# Patient Record
Sex: Female | Born: 1940 | Race: White | Hispanic: No | Smoking: Former smoker
Health system: Southern US, Community
[De-identification: ages and names within clinical notes are randomized; demographics above are authoritative.]

## PROBLEM LIST (undated history)

## (undated) DIAGNOSIS — F329 Major depressive disorder, single episode, unspecified: Secondary | ICD-10-CM

## (undated) DIAGNOSIS — I509 Heart failure, unspecified: Secondary | ICD-10-CM

## (undated) DIAGNOSIS — E119 Type 2 diabetes mellitus without complications: Secondary | ICD-10-CM

## (undated) DIAGNOSIS — F32A Depression, unspecified: Secondary | ICD-10-CM

## (undated) HISTORY — DX: Depression, unspecified: F32.A

## (undated) HISTORY — DX: Heart failure, unspecified: I50.9

## (undated) HISTORY — PX: TUBAL LIGATION: SHX77

## (undated) HISTORY — PX: SHOULDER ARTHROSCOPY: SHX128

## (undated) HISTORY — DX: Type 2 diabetes mellitus without complications: E11.9

## (undated) HISTORY — PX: CATARACT EXTRACTION, BILATERAL: SHX1313

## (undated) HISTORY — PX: CARDIAC SURGERY: SHX584

## (undated) HISTORY — PX: ABDOMINAL HYSTERECTOMY: SHX81

## (undated) HISTORY — DX: Major depressive disorder, single episode, unspecified: F32.9

## (undated) HISTORY — PX: OTHER SURGICAL HISTORY: SHX169

---

## 2015-04-28 DIAGNOSIS — G44219 Episodic tension-type headache, not intractable: Secondary | ICD-10-CM | POA: Diagnosis not present

## 2015-05-15 DIAGNOSIS — F419 Anxiety disorder, unspecified: Secondary | ICD-10-CM | POA: Diagnosis not present

## 2015-05-15 DIAGNOSIS — G894 Chronic pain syndrome: Secondary | ICD-10-CM | POA: Diagnosis not present

## 2015-05-15 DIAGNOSIS — E1165 Type 2 diabetes mellitus with hyperglycemia: Secondary | ICD-10-CM | POA: Diagnosis not present

## 2015-05-15 DIAGNOSIS — Z0000 Encounter for general adult medical examination without abnormal findings: Secondary | ICD-10-CM | POA: Diagnosis not present

## 2015-05-21 DIAGNOSIS — Z0000 Encounter for general adult medical examination without abnormal findings: Secondary | ICD-10-CM | POA: Diagnosis not present

## 2015-05-21 DIAGNOSIS — E1165 Type 2 diabetes mellitus with hyperglycemia: Secondary | ICD-10-CM | POA: Diagnosis not present

## 2015-08-17 DIAGNOSIS — I10 Essential (primary) hypertension: Secondary | ICD-10-CM | POA: Diagnosis not present

## 2015-08-17 DIAGNOSIS — Z1211 Encounter for screening for malignant neoplasm of colon: Secondary | ICD-10-CM | POA: Diagnosis not present

## 2015-08-17 DIAGNOSIS — T82898D Other specified complication of vascular prosthetic devices, implants and grafts, subsequent encounter: Secondary | ICD-10-CM | POA: Diagnosis not present

## 2015-08-17 DIAGNOSIS — L989 Disorder of the skin and subcutaneous tissue, unspecified: Secondary | ICD-10-CM | POA: Diagnosis not present

## 2015-08-17 DIAGNOSIS — E1165 Type 2 diabetes mellitus with hyperglycemia: Secondary | ICD-10-CM | POA: Diagnosis not present

## 2015-08-17 DIAGNOSIS — F341 Dysthymic disorder: Secondary | ICD-10-CM | POA: Diagnosis not present

## 2015-08-17 DIAGNOSIS — K5900 Constipation, unspecified: Secondary | ICD-10-CM | POA: Diagnosis not present

## 2015-09-07 DIAGNOSIS — Z5181 Encounter for therapeutic drug level monitoring: Secondary | ICD-10-CM | POA: Diagnosis not present

## 2015-10-06 DIAGNOSIS — F419 Anxiety disorder, unspecified: Secondary | ICD-10-CM | POA: Diagnosis not present

## 2015-10-06 DIAGNOSIS — L255 Unspecified contact dermatitis due to plants, except food: Secondary | ICD-10-CM | POA: Diagnosis not present

## 2015-10-06 DIAGNOSIS — G894 Chronic pain syndrome: Secondary | ICD-10-CM | POA: Diagnosis not present

## 2015-11-17 DIAGNOSIS — F419 Anxiety disorder, unspecified: Secondary | ICD-10-CM | POA: Diagnosis not present

## 2015-11-17 DIAGNOSIS — E118 Type 2 diabetes mellitus with unspecified complications: Secondary | ICD-10-CM | POA: Diagnosis not present

## 2015-11-17 DIAGNOSIS — G894 Chronic pain syndrome: Secondary | ICD-10-CM | POA: Diagnosis not present

## 2016-05-19 DIAGNOSIS — E1165 Type 2 diabetes mellitus with hyperglycemia: Secondary | ICD-10-CM | POA: Diagnosis not present

## 2016-05-19 DIAGNOSIS — I781 Nevus, non-neoplastic: Secondary | ICD-10-CM | POA: Diagnosis not present

## 2016-06-14 DIAGNOSIS — J019 Acute sinusitis, unspecified: Secondary | ICD-10-CM | POA: Diagnosis not present

## 2016-08-02 DIAGNOSIS — E559 Vitamin D deficiency, unspecified: Secondary | ICD-10-CM | POA: Diagnosis not present

## 2016-08-02 DIAGNOSIS — E119 Type 2 diabetes mellitus without complications: Secondary | ICD-10-CM | POA: Diagnosis not present

## 2016-08-02 DIAGNOSIS — R5383 Other fatigue: Secondary | ICD-10-CM | POA: Diagnosis not present

## 2016-08-02 DIAGNOSIS — E785 Hyperlipidemia, unspecified: Secondary | ICD-10-CM | POA: Diagnosis not present

## 2016-08-05 DIAGNOSIS — F329 Major depressive disorder, single episode, unspecified: Secondary | ICD-10-CM | POA: Diagnosis not present

## 2016-08-05 DIAGNOSIS — K219 Gastro-esophageal reflux disease without esophagitis: Secondary | ICD-10-CM | POA: Diagnosis not present

## 2016-08-05 DIAGNOSIS — Z6834 Body mass index (BMI) 34.0-34.9, adult: Secondary | ICD-10-CM | POA: Diagnosis not present

## 2016-08-05 DIAGNOSIS — Z0000 Encounter for general adult medical examination without abnormal findings: Secondary | ICD-10-CM | POA: Diagnosis not present

## 2016-10-25 ENCOUNTER — Ambulatory Visit (INDEPENDENT_AMBULATORY_CARE_PROVIDER_SITE_OTHER): Payer: Medicare Other | Admitting: Family

## 2016-10-25 ENCOUNTER — Encounter (INDEPENDENT_AMBULATORY_CARE_PROVIDER_SITE_OTHER): Payer: Self-pay

## 2016-10-25 ENCOUNTER — Telehealth: Payer: Self-pay | Admitting: Family

## 2016-10-25 ENCOUNTER — Encounter: Payer: Self-pay | Admitting: Family

## 2016-10-25 VITALS — BP 114/65 | HR 82 | Temp 97.1°F | Ht 63.5 in | Wt 203.6 lb

## 2016-10-25 DIAGNOSIS — Z951 Presence of aortocoronary bypass graft: Secondary | ICD-10-CM | POA: Diagnosis not present

## 2016-10-25 DIAGNOSIS — Z794 Long term (current) use of insulin: Secondary | ICD-10-CM

## 2016-10-25 DIAGNOSIS — I2510 Atherosclerotic heart disease of native coronary artery without angina pectoris: Secondary | ICD-10-CM | POA: Diagnosis not present

## 2016-10-25 DIAGNOSIS — E1169 Type 2 diabetes mellitus with other specified complication: Secondary | ICD-10-CM

## 2016-10-25 DIAGNOSIS — E1159 Type 2 diabetes mellitus with other circulatory complications: Secondary | ICD-10-CM | POA: Insufficient documentation

## 2016-10-25 DIAGNOSIS — E785 Hyperlipidemia, unspecified: Secondary | ICD-10-CM | POA: Diagnosis not present

## 2016-10-25 DIAGNOSIS — F5101 Primary insomnia: Secondary | ICD-10-CM | POA: Diagnosis not present

## 2016-10-25 DIAGNOSIS — E1165 Type 2 diabetes mellitus with hyperglycemia: Secondary | ICD-10-CM | POA: Diagnosis not present

## 2016-10-25 DIAGNOSIS — E119 Type 2 diabetes mellitus without complications: Secondary | ICD-10-CM | POA: Insufficient documentation

## 2016-10-25 DIAGNOSIS — E1149 Type 2 diabetes mellitus with other diabetic neurological complication: Secondary | ICD-10-CM | POA: Diagnosis not present

## 2016-10-25 LAB — BAYER DCA HB A1C WAIVED: HB A1C (BAYER DCA - WAIVED): 9.7 % — ABNORMAL HIGH (ref <7.0)

## 2016-10-25 MED ORDER — TRAZODONE HCL 50 MG PO TABS: 50.0000 mg | ORAL_TABLET | Freq: Every evening | ORAL | 1 refills | Status: DC | PRN

## 2016-10-25 NOTE — Patient Instructions (Signed)
Diabetes Mellitus and Food It is important for you to manage your blood sugar (glucose) level. Your blood glucose level can be greatly affected by what you eat. Eating healthier foods in the appropriate amounts throughout the day at about the same time each day will help you control your blood glucose level. It can also help slow or prevent worsening of your diabetes mellitus. Healthy eating may even help you improve the level of your blood pressure and reach or maintain a healthy weight. General recommendations for healthful eating and cooking habits include:  Eating meals and snacks regularly. Avoid going long periods of time without eating to lose weight.  Eating a diet that consists mainly of plant-based foods, such as fruits, vegetables, nuts, legumes, and whole grains.  Using low-heat cooking methods, such as baking, instead of high-heat cooking methods, such as deep frying.  Work with your dietitian to make sure you understand how to use the Nutrition Facts information on food labels. How can food affect me? Carbohydrates Carbohydrates affect your blood glucose level more than any other type of food. Your dietitian will help you determine how many carbohydrates to eat at each meal and teach you how to count carbohydrates. Counting carbohydrates is important to keep your blood glucose at a healthy level, especially if you are using insulin or taking certain medicines for diabetes mellitus. Alcohol Alcohol can cause sudden decreases in blood glucose (hypoglycemia), especially if you use insulin or take certain medicines for diabetes mellitus. Hypoglycemia can be a life-threatening condition. Symptoms of hypoglycemia (sleepiness, dizziness, and disorientation) are similar to symptoms of having too much alcohol. If your health care provider has given you approval to drink alcohol, do so in moderation and use the following guidelines:  Women should not have more than one drink per day, and men  should not have more than two drinks per day. One drink is equal to: ? 12 oz of beer. ? 5 oz of wine. ? 1 oz of hard liquor.  Do not drink on an empty stomach.  Keep yourself hydrated. Have water, diet soda, or unsweetened iced tea.  Regular soda, juice, and other mixers might contain a lot of carbohydrates and should be counted.  What foods are not recommended? As you make food choices, it is important to remember that all foods are not the same. Some foods have fewer nutrients per serving than other foods, even though they might have the same number of calories or carbohydrates. It is difficult to get your body what it needs when you eat foods with fewer nutrients. Examples of foods that you should avoid that are high in calories and carbohydrates but low in nutrients include:  Trans fats (most processed foods list trans fats on the Nutrition Facts label).  Regular soda.  Juice.  Candy.  Sweets, such as cake, pie, doughnuts, and cookies.  Fried foods.  What foods can I eat? Eat nutrient-rich foods, which will nourish your body and keep you healthy. The food you should eat also will depend on several factors, including:  The calories you need.  The medicines you take.  Your weight.  Your blood glucose level.  Your blood pressure level.  Your cholesterol level.  You should eat a variety of foods, including:  Protein. ? Lean cuts of meat. ? Proteins low in saturated fats, such as fish, egg whites, and beans. Avoid processed meats.  Fruits and vegetables. ? Fruits and vegetables that may help control blood glucose levels, such as apples,   mangoes, and yams.  Dairy products. ? Choose fat-free or low-fat dairy products, such as milk, yogurt, and cheese.  Grains, bread, pasta, and rice. ? Choose whole grain products, such as multigrain bread, whole oats, and brown rice. These foods may help control blood pressure.  Fats. ? Foods containing healthful fats, such as  nuts, avocado, olive oil, canola oil, and fish.  Does everyone with diabetes mellitus have the same meal plan? Because every person with diabetes mellitus is different, there is not one meal plan that works for everyone. It is very important that you meet with a dietitian who will help you create a meal plan that is just right for you. This information is not intended to replace advice given to you by your health care provider. Make sure you discuss any questions you have with your health care provider. Document Released: 11/25/2004 Document Revised: 08/06/2015 Document Reviewed: 01/25/2013 Elsevier Interactive Patient Education  2017 Elsevier Inc.  

## 2016-10-25 NOTE — Progress Notes (Signed)
Subjective:    Patient ID: [place], female    DOB: 10-03-1940, 76 y.o.   MRN: [id number]  Pt presents to the office today to establish care. PT recently moved to this area from [place]. PT states she had a CABG in 2008, but has not seen a Cardiologists since.  Diabetes  She presents for her initial diabetic visit. She has type 2 diabetes mellitus. Her disease course has been worsening. There are no hypoglycemic associated symptoms. Associated symptoms include foot paresthesias. Pertinent negatives for diabetes include no blurred vision, no foot ulcerations and no visual change. There are no hypoglycemic complications. Symptoms are stable. Diabetic complications include heart disease and peripheral neuropathy. Pertinent negatives for diabetic complications include no CVA or nephropathy. Risk factors for coronary artery disease include dyslipidemia, diabetes mellitus and female sex. She is following a generally unhealthy diet. Her breakfast blood glucose range is generally >200 mg/dl. Eye exam is not current.  Hyperlipidemia  This is a chronic problem. The current episode started more than 1 year ago. The problem is uncontrolled. Recent lipid tests were reviewed and are high. Exacerbating diseases include obesity. Current antihyperlipidemic treatment includes statins. The current treatment provides mild improvement of lipids. Risk factors for coronary artery disease include diabetes mellitus, dyslipidemia, post-menopausal, a sedentary lifestyle and family history.  Insomnia  Primary symptoms: difficulty falling asleep, frequent awakening.  The current episode started more than one year. The onset quality is sudden. The problem has been waxing and waning since onset. The treatment provided mild relief.  Diabetic Neuropathy PT stats this is intermittent and denies any pain, but stiffness and numbness.     Review of Systems  Eyes: Negative for blurred vision.  Psychiatric/Behavioral: The  patient has insomnia.   All other systems reviewed and are negative.   Family History  Problem Relation Age of Onset  . Heart disease Father    Social History   Social History  . Marital status: Widowed    Spouse name: N/A  . Number of children: N/A  . Years of education: N/A   Social History Main Topics  . Smoking status: Never Smoker  . Smokeless tobacco: Never Used  . Alcohol use No  . Drug use: No  . Sexual activity: Yes   Other Topics Concern  . None   Social History Narrative  . None       Objective:   Physical Exam  Constitutional: She is oriented to person, place, and time. She appears well-developed and well-nourished. No distress.  HENT:  Head: Normocephalic and atraumatic.  Right Ear: External ear normal.  Left Ear: External ear normal.  Nose: Nose normal.  Mouth/Throat: Oropharynx is clear and moist.  Eyes: Pupils are equal, round, and reactive to light.  Neck: Normal range of motion. Neck supple. No thyromegaly present.  Cardiovascular: Normal rate, regular rhythm, normal heart sounds and intact distal pulses.   No murmur heard. Pulmonary/Chest: Effort normal and breath sounds normal. No respiratory distress. She has no wheezes.  Abdominal: Soft. Bowel sounds are normal. She exhibits no distension. There is no tenderness.  Musculoskeletal: Normal range of motion. She exhibits no edema or tenderness.  Neurological: She is alert and oriented to person, place, and time.  Skin: Skin is warm and dry.  Psychiatric: She has a normal mood and affect. Her behavior is normal. Judgment and thought content normal.  Vitals reviewed.   See Diabetic foot note  Ht 5' 3.5" (1.613 m)   Wt 203 lb 9.6  oz (92.4 kg)   BMI 35.50 kg/m      Assessment & Plan:  1. Type 2 diabetes mellitus with hyperglycemia, with long-term current use of insulin (HCC) - Bayer DCA Hb A1c Waived - CMP14+EGFR - Microalbumin / creatinine urine ratio - Ambulatory referral to  Ophthalmology  2. Hyperlipidemia associated with type 2 diabetes mellitus ([place]) - CMP14+EGFR - Lipid panel - Ambulatory referral to Cardiology  3. Coronary artery disease involving native heart without angina pectoris, unspecified vessel or lesion type - CMP14+EGFR - Ambulatory referral to Cardiology  4. Hx of CAB - CMP14+EGFR - Ambulatory referral to Cardiology  5. Other diabetic neurological complication associated with type 2 diabetes mellitus ([place]) - CMP14+EGFR  6. Primary insomnia Pt started on trazodone today Sleep ritual discussed - traZODone (DESYREL) 50 MG tablet; Take 1-2 tablets (50-100 mg total) by mouth at bedtime as needed for sleep.  Dispense: 60 tablet; Refill: 1   Continue all meds Labs pending Health Maintenance reviewed Diet and exercise encouraged RTO 3 months   Evelina Dun, FNP

## 2016-10-26 LAB — CMP14+EGFR
ALK PHOS: 115 IU/L (ref 39–117)
ALT: 17 IU/L (ref 0–32)
AST: 16 IU/L (ref 0–40)
Albumin/Globulin Ratio: 2.4 — ABNORMAL HIGH (ref 1.2–2.2)
Albumin: 4.3 g/dL (ref 3.5–4.8)
BUN/Creatinine Ratio: 19 (ref 12–28)
BUN: 20 mg/dL (ref 8–27)
Bilirubin Total: 0.2 mg/dL (ref 0.0–1.2)
CO2: 24 mmol/L (ref 20–29)
CREATININE: 1.04 mg/dL — AB (ref 0.57–1.00)
Calcium: 9.5 mg/dL (ref 8.7–10.3)
Chloride: 102 mmol/L (ref 96–106)
GFR calc Af Amer: 60 mL/min/{1.73_m2} (ref >59)
GFR calc non Af Amer: 52 mL/min/{1.73_m2} — ABNORMAL LOW (ref >59)
GLOBULIN, TOTAL: 1.8 g/dL (ref 1.5–4.5)
GLUCOSE: 187 mg/dL — AB (ref 65–99)
Potassium: 5.1 mmol/L (ref 3.5–5.2)
SODIUM: 141 mmol/L (ref 134–144)
Total Protein: 6.1 g/dL (ref 6.0–8.5)

## 2016-10-26 LAB — LIPID PANEL
CHOLESTEROL TOTAL: 147 mg/dL (ref 100–199)
Chol/HDL Ratio: 3.1 ratio (ref 0.0–4.4)
HDL: 47 mg/dL (ref >39)
LDL CALC: 81 mg/dL (ref 0–99)
TRIGLYCERIDES: 93 mg/dL (ref 0–149)
VLDL Cholesterol Cal: 19 mg/dL (ref 5–40)

## 2016-10-26 LAB — MICROALBUMIN / CREATININE URINE RATIO
Creatinine, Urine: 38.9 mg/dL
Microalb/Creat Ratio: <7.7 mg/g creat (ref 0.0–30.0)
Microalbumin, Urine: <3 ug/mL

## 2016-10-27 ENCOUNTER — Other Ambulatory Visit: Payer: Self-pay | Admitting: Family

## 2016-10-27 NOTE — Telephone Encounter (Signed)
Pt called and said it was [facility] in [place] [address]

## 2016-10-28 ENCOUNTER — Other Ambulatory Visit: Payer: Self-pay | Admitting: Family

## 2016-10-28 MED ORDER — INSULIN DEGLUDEC 200 UNIT/ML ~~LOC~~ SOPN: 84.0000 [IU] | PEN_INJECTOR | Freq: Every day | SUBCUTANEOUS | 3 refills | Status: DC

## 2016-11-03 ENCOUNTER — Other Ambulatory Visit: Payer: Self-pay | Admitting: Family

## 2016-11-03 MED ORDER — INSULIN DETEMIR 100 UNIT/ML ~~LOC~~ SOLN: 54.0000 [IU] | Freq: Two times a day (BID) | SUBCUTANEOUS | 6 refills | Status: DC

## 2016-12-07 DIAGNOSIS — Z0289 Encounter for other administrative examinations: Secondary | ICD-10-CM

## 2016-12-13 ENCOUNTER — Telehealth: Payer: Self-pay | Admitting: Family

## 2016-12-13 MED ORDER — NITROGLYCERIN 0.4 MG SL SUBL: 0.4000 mg | SUBLINGUAL_TABLET | SUBLINGUAL | 3 refills | Status: AC | PRN

## 2016-12-13 MED ORDER — METFORMIN HCL 1000 MG PO TABS: 1000.0000 mg | ORAL_TABLET | Freq: Two times a day (BID) | ORAL | 1 refills | Status: DC

## 2016-12-13 MED ORDER — PRAVASTATIN SODIUM 40 MG PO TABS: 40.0000 mg | ORAL_TABLET | Freq: Every day | ORAL | 1 refills | Status: DC

## 2016-12-13 MED ORDER — GLIPIZIDE 10 MG PO TABS: 10.0000 mg | ORAL_TABLET | Freq: Every day | ORAL | 1 refills | Status: DC

## 2016-12-13 NOTE — Telephone Encounter (Signed)
Prescriptions sent to pharmacy

## 2017-01-03 ENCOUNTER — Ambulatory Visit (INDEPENDENT_AMBULATORY_CARE_PROVIDER_SITE_OTHER): Payer: Medicare Other | Admitting: Cardiovascular Disease

## 2017-01-03 ENCOUNTER — Encounter: Payer: Self-pay | Admitting: Cardiovascular Disease

## 2017-01-03 VITALS — BP 118/72 | HR 96 | Ht 62.5 in | Wt 204.0 lb

## 2017-01-03 DIAGNOSIS — E785 Hyperlipidemia, unspecified: Secondary | ICD-10-CM

## 2017-01-03 DIAGNOSIS — R0600 Dyspnea, unspecified: Secondary | ICD-10-CM | POA: Diagnosis not present

## 2017-01-03 DIAGNOSIS — I25708 Atherosclerosis of coronary artery bypass graft(s), unspecified, with other forms of angina pectoris: Secondary | ICD-10-CM

## 2017-01-03 DIAGNOSIS — G4730 Sleep apnea, unspecified: Secondary | ICD-10-CM

## 2017-01-03 DIAGNOSIS — R5383 Other fatigue: Secondary | ICD-10-CM

## 2017-01-03 DIAGNOSIS — Z951 Presence of aortocoronary bypass graft: Secondary | ICD-10-CM

## 2017-01-03 DIAGNOSIS — R0609 Other forms of dyspnea: Secondary | ICD-10-CM | POA: Diagnosis not present

## 2017-01-03 NOTE — Patient Instructions (Signed)
Your physician recommends that you schedule a follow-up appointment in: 2 months with Latta    Your physician recommends that you continue on your current medications as directed. Please refer to the Current Medication list given to you today.   If you need a refill on your cardiac medications before your next appointment, please call your pharmacy.    Your physician has requested that you have a lexiscan myoview. For further information please visit [email or web address]. Please follow instruction sheet, as given.  Your physician has requested that you have an echocardiogram. Echocardiography is a painless test that uses sound waves to create images of your heart. It provides your doctor with information about the size and shape of your heart and how well your heart's chambers and valves are working. This procedure takes approximately one hour. There are no restrictions for this procedure.  Your physician has recommended that you have a sleep study. This test records several body functions during sleep, including: brain activity, eye movement, oxygen and carbon dioxide blood levels, heart rate and rhythm, breathing rate and rhythm, the flow of air through your mouth and nose, snoring, body muscle movements, and chest and belly movement.   You have been referred to Electrophysiology for Pacer Check, they will call you with an apt         Thank you for choosing [facility] !

## 2017-01-03 NOTE — Progress Notes (Addendum)
CARDIOLOGY CONSULT NOTE  Patient ID: [place] MRN: [id number] DOB/AGE: [birth year] 76 y.o.  Admit date: (Not on file) Primary Physician: Sharion Balloon, FNP Referring Physician: Evelina Dun FNP  Reason for Consultation: CAD with CABG  HPI: [place] is a 76 y.o. female who is being seen today for the evaluation of coronary artery disease with a history of CABG at the request of Sharion Balloon, FNP.   CABG was p[place]erfromed in [place] in 2008 and she recently moved to [place].  She also has type 2 diabetes and dyslipidemia.  Lipids 10/25/16: TC 147, TG 93, HDL 47, LDL 81.  Renal function was normal on 10/25/16.  [place] She grew up [place] and moved to [place] to be with her boyfriend of 3-4 years.  [place] I have requested records.  She followed up with a cardiologist once after her surgery but not since that time.  She says she also has a defibrillator. She denies shocks from her device.  She said prior to CABG, she had left arm pain and felt sick to her stomach.  She denies such symptoms at this time.  However, she has had exertional fatigue for about a year. She denies exertional chest pain but does lose her breath fairly quickly with exertion on level ground.  She denies palpitations and leg swelling. She has woken up gasping for breath on occasion, and has also awoken herself with her snoring.  ECG performed in our office today which I personally interpreted demonstrates sinus rhythm with occasional ventricular paced beats. There are no significant ST-T abnormalities.    No Known Allergies  Current Outpatient Prescriptions  Medication Sig Dispense Refill  . aspirin EC 81 MG tablet Take 81 mg by mouth daily.    Marland Kitchen glipiZIDE (GLUCOTROL) 10 MG tablet Take 1 tablet (10 mg total) by mouth daily before breakfast. 90 tablet 1    . insulin aspart (NOVOLOG) 100 UNIT/ML injection Inject 7 Units into the skin as needed for high blood sugar.    . insulin detemir (LEVEMIR) 100 UNIT/ML injection Inject 0.54 mLs (54 Units total) into the skin 2 (two) times daily. 10 mL 6  . metFORMIN (GLUCOPHAGE) 1000 MG tablet Take 1 tablet (1,000 mg total) by mouth 2 (two) times daily with a meal. 180 tablet 1  . nitroGLYCERIN (NITROSTAT) 0.4 MG SL tablet Place 1 tablet (0.4 mg total) under the tongue every 5 (five) minutes as needed for chest pain. 50 tablet 3  . pravastatin (PRAVACHOL) 40 MG tablet Take 1 tablet (40 mg total) by mouth daily. 90 tablet 1   No current facility-administered medications for this visit.     Past Medical History:  Diagnosis Date  . CHF (congestive heart failure) ([place])   . Depression   . Diabetes mellitus without complication [facility])     Past Surgical History:  Procedure Laterality Date  . ABDOMINAL HYSTERECTOMY    . CARDIAC SURGERY     Blockages  . carpel tunnel bilaterally    . CATARACT EXTRACTION, BILATERAL    . great toe surgery Left   . SHOULDER ARTHROSCOPY Right    Spur removal  . TUBAL LIGATION      Social History   Social History  . Marital status: Widowed    Spouse name: N/A  . Number of children: N/A  . Years of education:  N/A   Occupational History  . Not on file.   Social History Main Topics  . Smoking status: Never Smoker  . Smokeless tobacco: Never Used  . Alcohol use No  . Drug use: No  . Sexual activity: Yes   Other Topics Concern  . Not on file   Social History Narrative  . No narrative on file     No family history of premature CAD in 1st degree relatives.  Current Meds  Medication Sig  . aspirin EC 81 MG tablet Take 81 mg by mouth daily.  Marland Kitchen glipiZIDE (GLUCOTROL) 10 MG tablet Take 1 tablet (10 mg total) by mouth daily before breakfast.  . insulin aspart (NOVOLOG) 100 UNIT/ML injection Inject 7 Units into the skin as needed for high blood sugar.  . insulin  detemir (LEVEMIR) 100 UNIT/ML injection Inject 0.54 mLs (54 Units total) into the skin 2 (two) times daily.  . metFORMIN (GLUCOPHAGE) 1000 MG tablet Take 1 tablet (1,000 mg total) by mouth 2 (two) times daily with a meal.  . nitroGLYCERIN (NITROSTAT) 0.4 MG SL tablet Place 1 tablet (0.4 mg total) under the tongue every 5 (five) minutes as needed for chest pain.  . pravastatin (PRAVACHOL) 40 MG tablet Take 1 tablet (40 mg total) by mouth daily.      Review of systems complete and found to be negative unless listed above in HPI    Physical exam Blood pressure 118/72, pulse 96, height 5' 2.5" (1.588 m), weight 204 lb (92.5 kg), SpO2 97 %. General: NAD Neck: No JVD, no thyromegaly or thyroid nodule.  Lungs: Clear to auscultation bilaterally with normal respiratory effort. CV: Nondisplaced PMI. Regular rate and rhythm, normal S1/S2, no S3/S4, no murmur.  No peripheral edema.  No carotid bruit.    Abdomen: Soft, nontender, no distention.  Skin: Intact without lesions or rashes.  Neurologic: Alert and oriented x 3.  Psych: Normal affect. Extremities: No clubbing or cyanosis.  HEENT: Normal.   ECG: Most recent ECG reviewed.   Labs: Lab Results  Component Value Date/Time   K 5.1 10/25/2016 09:48 AM   BUN 20 10/25/2016 09:48 AM   CREATININE 1.04 (H) 10/25/2016 09:48 AM   ALT 17 10/25/2016 09:48 AM     Lipids: Lab Results  Component Value Date/Time   LDLCALC 81 10/25/2016 09:48 AM   CHOL 147 10/25/2016 09:48 AM   TRIG 93 10/25/2016 09:48 AM   HDL 47 10/25/2016 09:48 AM        ASSESSMENT AND PLAN:  1. Coronary artery disease with history of CABG with progressive exertional fatigue and dyspnea: It is unclear if these represent anginal symptoms. I will proceed with a nuclear myocardial perfusion imaging study to evaluate for ischemia (Lexiscan Myoview). Continue ASA and pravastatin. Will try and obtain records from [facility] [place]  2. Dyslipidemia:  Lipids reviewed above. Ideally, LDL should be lower. Will monitor.  3. Progressive exertional dyspnea and paroxysmal nocturnal dyspnea: JVP is normal and she has no leg edema or pulmonary edema on exam. I will order a 2-D echocardiogram with Doppler to evaluate cardiac structure, function, and regional wall motion.  4. AICD: I will enroll her in device clinic for routine monitoring and surveillance. Denies ICD shocks. She does not know who is her device manufacturer. I will try and obtain records from [place].  5. Progressive fatigue with snoring (sleep disordered breathing): I will obtain a sleep study. I told her that sleep apnea is an independent  risk factor for CVA, MI, and arrhythmias.   Disposition: Follow up in 2 months   Signed: Kate Sable, M.D., F.A.C.C.  01/03/2017, 11:56 AM   ADDENDUM: Records from [place] received. Patient underwent 5-vessel CABG with LIMA to LAD, SVG to 2nd diagonal, SVG to 1st obtuse marginal, and sequential SVG's to right PDA and posterolateral coronary arteries on 10/23/2006.  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

## 2017-01-05 ENCOUNTER — Ambulatory Visit: Payer: Self-pay | Admitting: Cardiovascular Disease

## 2017-01-16 ENCOUNTER — Ambulatory Visit (INDEPENDENT_AMBULATORY_CARE_PROVIDER_SITE_OTHER): Payer: Medicare Other | Admitting: Family Medicine

## 2017-01-16 ENCOUNTER — Encounter: Payer: Self-pay | Admitting: Family Medicine

## 2017-01-16 VITALS — BP 112/71 | HR 81 | Temp 96.6°F | Ht 62.5 in | Wt 204.0 lb

## 2017-01-16 DIAGNOSIS — J40 Bronchitis, not specified as acute or chronic: Secondary | ICD-10-CM | POA: Diagnosis not present

## 2017-01-16 DIAGNOSIS — J329 Chronic sinusitis, unspecified: Secondary | ICD-10-CM | POA: Diagnosis not present

## 2017-01-16 DIAGNOSIS — I25708 Atherosclerosis of coronary artery bypass graft(s), unspecified, with other forms of angina pectoris: Secondary | ICD-10-CM | POA: Diagnosis not present

## 2017-01-16 MED ORDER — LEVOFLOXACIN 500 MG PO TABS: 500.0000 mg | ORAL_TABLET | Freq: Every day | ORAL | 0 refills | Status: DC

## 2017-01-16 NOTE — Progress Notes (Signed)
Chief Complaint  Patient presents with  . Cough    pt here today c/o cough    HPI  Patient presents today for Patient presents with upper respiratory congestion. No rhinorrhea. There is no sore throat. Patient reports coughing frequently as well.  No sputum noted. There is no fever, chills or sweats. The patient denies being short of breath. Onset was 2 days ago. Gradually worsening.  PMH: Smoking status noted ROS: Per HPI  Objective: BP 112/71   Pulse 81   Temp (!) 96.6 F (35.9 C) (Oral)   Ht 5' 2.5" (1.588 m)   Wt 204 lb (92.5 kg)   BMI 36.72 kg/m  Gen: NAD, alert, cooperative with exam HEENT: NCAT, Nasal passages swollen, red TMS clear CV: RRR, good S1/S2, no murmur Resp: Bronchitis changes with scattered wheezes, non-labored Ext: No edema, warm Neuro: Alert and oriented, No gross deficits  Assessment and plan:  1. Sinobronchitis     Meds ordered this encounter  Medications  . levofloxacin (LEVAQUIN) 500 MG tablet    Sig: Take 1 tablet (500 mg total) daily by mouth.    Dispense:  10 tablet    Refill:  0    No orders of the defined types were placed in this encounter.   Follow up as needed.  Claretta Fraise, MD

## 2017-01-17 ENCOUNTER — Encounter (HOSPITAL_COMMUNITY): Payer: Medicare Other

## 2017-01-17 ENCOUNTER — Encounter (HOSPITAL_COMMUNITY): Admission: RE | Admit: 2017-01-17 | Payer: TRICARE For Life (TFL) | Source: Ambulatory Visit

## 2017-01-17 ENCOUNTER — Ambulatory Visit (HOSPITAL_COMMUNITY): Payer: TRICARE For Life (TFL) | Attending: Cardiovascular Disease

## 2017-01-26 ENCOUNTER — Encounter: Payer: Self-pay | Admitting: Family

## 2017-01-26 ENCOUNTER — Ambulatory Visit (INDEPENDENT_AMBULATORY_CARE_PROVIDER_SITE_OTHER): Payer: Medicare Other | Admitting: Family

## 2017-01-26 ENCOUNTER — Ambulatory Visit (INDEPENDENT_AMBULATORY_CARE_PROVIDER_SITE_OTHER): Payer: Medicare Other

## 2017-01-26 VITALS — BP 132/71 | HR 79 | Temp 97.1°F | Ht 62.0 in | Wt 203.8 lb

## 2017-01-26 DIAGNOSIS — E1169 Type 2 diabetes mellitus with other specified complication: Secondary | ICD-10-CM

## 2017-01-26 DIAGNOSIS — E1165 Type 2 diabetes mellitus with hyperglycemia: Secondary | ICD-10-CM

## 2017-01-26 DIAGNOSIS — G8929 Other chronic pain: Secondary | ICD-10-CM

## 2017-01-26 DIAGNOSIS — M25511 Pain in right shoulder: Secondary | ICD-10-CM | POA: Diagnosis not present

## 2017-01-26 DIAGNOSIS — Z1211 Encounter for screening for malignant neoplasm of colon: Secondary | ICD-10-CM | POA: Diagnosis not present

## 2017-01-26 DIAGNOSIS — Z794 Long term (current) use of insulin: Secondary | ICD-10-CM

## 2017-01-26 DIAGNOSIS — F411 Generalized anxiety disorder: Secondary | ICD-10-CM

## 2017-01-26 DIAGNOSIS — M1990 Unspecified osteoarthritis, unspecified site: Secondary | ICD-10-CM | POA: Insufficient documentation

## 2017-01-26 DIAGNOSIS — E785 Hyperlipidemia, unspecified: Secondary | ICD-10-CM

## 2017-01-26 DIAGNOSIS — E1149 Type 2 diabetes mellitus with other diabetic neurological complication: Secondary | ICD-10-CM

## 2017-01-26 DIAGNOSIS — H9319 Tinnitus, unspecified ear: Secondary | ICD-10-CM

## 2017-01-26 DIAGNOSIS — M19011 Primary osteoarthritis, right shoulder: Secondary | ICD-10-CM | POA: Diagnosis not present

## 2017-01-26 DIAGNOSIS — I25708 Atherosclerosis of coronary artery bypass graft(s), unspecified, with other forms of angina pectoris: Secondary | ICD-10-CM

## 2017-01-26 DIAGNOSIS — Z951 Presence of aortocoronary bypass graft: Secondary | ICD-10-CM

## 2017-01-26 LAB — BAYER DCA HB A1C WAIVED: HB A1C: 10.3 % — AB (ref <7.0)

## 2017-01-26 MED ORDER — FLUTICASONE PROPIONATE 50 MCG/ACT NA SUSP: 2.0000 | Freq: Every day | NASAL | 6 refills | Status: DC

## 2017-01-26 MED ORDER — ESCITALOPRAM OXALATE 10 MG PO TABS: 10.0000 mg | ORAL_TABLET | Freq: Every day | ORAL | 3 refills | Status: DC

## 2017-01-26 MED ORDER — MELOXICAM 7.5 MG PO TABS: 7.5000 mg | ORAL_TABLET | Freq: Every day | ORAL | 3 refills | Status: DC

## 2017-01-26 NOTE — Patient Instructions (Signed)

## 2017-01-26 NOTE — Progress Notes (Signed)
Subjective:    Patient ID: Selena Travis, female    DOB: January 15, 1941, 76 y.o.   MRN: [id number]  PT presents to the office today for chronic follow up. Pt has hx of CAD and CABG and is followed by Cardiologists every 6 months.   PT complaining of tinnitus that has been going on for years, but is worse over the last few weeks.  Diabetes  She presents for her follow-up diabetic visit. She has type 2 diabetes mellitus. Her disease course has been stable. Hypoglycemia symptoms include nervousness/anxiousness. Associated symptoms include foot paresthesias. Pertinent negatives for diabetes include no visual change. There are no hypoglycemic complications. Symptoms are stable. Diabetic complications include heart disease and nephropathy. Pertinent negatives for diabetic complications include no peripheral neuropathy. Risk factors for coronary artery disease include diabetes mellitus, dyslipidemia, family history, sedentary lifestyle and post-menopausal. She is following a generally healthy diet. Her breakfast blood glucose range is generally 180-200 mg/dl.  Hyperlipidemia  This is a chronic problem. The current episode started more than 1 year ago. The problem is controlled. Recent lipid tests were reviewed and are normal. Exacerbating diseases include obesity. Current antihyperlipidemic treatment includes statins. The current treatment provides moderate improvement of lipids. Risk factors for coronary artery disease include diabetes mellitus, dyslipidemia, family history, obesity, a sedentary lifestyle and post-menopausal.  Shoulder Pain   The pain is present in the right shoulder. This is a recurrent problem. The current episode started more than 1 month ago. There has been no history of extremity trauma. The problem occurs intermittently. The problem has been waxing and waning. The quality of the pain is described as aching. The pain is at a severity of 6/10. The pain is moderate. Associated symptoms  include a limited range of motion. Pertinent negatives include no joint locking, joint swelling, numbness or stiffness. She has tried rest and NSAIDS for the symptoms. The treatment provided mild relief.  Anxiety  Presents for follow-up visit. Symptoms include decreased concentration, depressed mood, excessive worry, irritability, nervous/anxious behavior and restlessness. Symptoms occur most days.    Diabetic Neuropathy Pt states she has intermittent numbness of 10 out 10.     Review of Systems  Constitutional: Positive for irritability.  Musculoskeletal: Negative for stiffness.  Neurological: Negative for numbness.  Psychiatric/Behavioral: Positive for decreased concentration. The patient is nervous/anxious.   All other systems reviewed and are negative.      Objective:   Physical Exam  Constitutional: She is oriented to person, place, and time. She appears well-developed and well-nourished. No distress.  HENT:  Head: Normocephalic and atraumatic.  Right Ear: External ear normal.  Left Ear: External ear normal.  Nose: Mucosal edema and rhinorrhea present.  Mouth/Throat: Oropharynx is clear and moist.  Eyes: Pupils are equal, round, and reactive to light.  Neck: Normal range of motion. Neck supple. No thyromegaly present.  Cardiovascular: Normal rate, regular rhythm, normal heart sounds and intact distal pulses.  No murmur heard. Pulmonary/Chest: Effort normal and breath sounds normal. No respiratory distress. She has no wheezes.  Abdominal: Soft. Bowel sounds are normal. She exhibits no distension. There is no tenderness.  Musculoskeletal: She exhibits no edema or tenderness.  Neurological: She is alert and oriented to person, place, and time.  Skin: Skin is warm and dry.  Psychiatric: She has a normal mood and affect. Her behavior is normal. Judgment and thought content normal.  Vitals reviewed.    BP 132/71   Pulse 79   Temp (!) 97.1 F (36.2 C) (  Oral)   Ht '5\' 2"'   (1.575 m)   Wt 203 lb 12.8 oz (92.4 kg)   BMI 37.28 kg/m      Assessment & Plan:  1. Type 2 diabetes mellitus with hyperglycemia, with long-term current use of insulin (HCC) - CMP14+EGFR - Bayer DCA Hb A1c Waived  2. Hyperlipidemia associated with type 2 diabetes mellitus ([place]) - Lipid panel  3. Other diabetic neurological complication associated with type 2 diabetes mellitus ([place])  4. Hx of CABG  5. Colon cancer screening - Fecal occult blood, imunochemical; Future  6. Tinnitus, unspecified laterality -Flonase   7. Chronic right shoulder pain Rest Ice - DG Shoulder Right; Future  8. Primary osteoarthritis of right shoulder No other NSAID's - meloxicam (MOBIC) 7.5 MG tablet; Take 1 tablet (7.5 mg total) daily by mouth.  Dispense: 30 tablet; Refill: 3  9. GAD (generalized anxiety disorder) PT started on lexapro 10 mg today Stress management discussed - escitalopram (LEXAPRO) 10 MG tablet; Take 1 tablet (10 mg total) daily by mouth.  Dispense: 90 tablet; Refill: 3   Continue all meds Labs pending Health Maintenance reviewed Diet and exercise encouraged RTO 6 weeks to recheck GAD  Evelina Dun, FNP

## 2017-01-27 LAB — CMP14+EGFR
ALT: 13 IU/L (ref 0–32)
AST: 13 IU/L (ref 0–40)
Albumin/Globulin Ratio: 2.3 — ABNORMAL HIGH (ref 1.2–2.2)
Albumin: 4.1 g/dL (ref 3.5–4.8)
Alkaline Phosphatase: 111 IU/L (ref 39–117)
BUN/Creatinine Ratio: 16 (ref 12–28)
BUN: 19 mg/dL (ref 8–27)
CHLORIDE: 101 mmol/L (ref 96–106)
CO2: 22 mmol/L (ref 20–29)
Calcium: 9.2 mg/dL (ref 8.7–10.3)
Creatinine, Ser: 1.2 mg/dL — ABNORMAL HIGH (ref 0.57–1.00)
GFR calc non Af Amer: 44 mL/min/{1.73_m2} — ABNORMAL LOW (ref >59)
GFR, EST AFRICAN AMERICAN: 51 mL/min/{1.73_m2} — AB (ref >59)
GLUCOSE: 233 mg/dL — AB (ref 65–99)
Globulin, Total: 1.8 g/dL (ref 1.5–4.5)
POTASSIUM: 5.1 mmol/L (ref 3.5–5.2)
Sodium: 138 mmol/L (ref 134–144)
TOTAL PROTEIN: 5.9 g/dL — AB (ref 6.0–8.5)

## 2017-01-27 LAB — LIPID PANEL
Chol/HDL Ratio: 2.9 ratio (ref 0.0–4.4)
Cholesterol, Total: 135 mg/dL (ref 100–199)
HDL: 47 mg/dL (ref >39)
LDL CALC: 60 mg/dL (ref 0–99)
Triglycerides: 138 mg/dL (ref 0–149)
VLDL CHOLESTEROL CAL: 28 mg/dL (ref 5–40)

## 2017-01-28 ENCOUNTER — Encounter (HOSPITAL_COMMUNITY): Payer: Self-pay | Admitting: Emergency Medicine

## 2017-01-28 ENCOUNTER — Emergency Department (HOSPITAL_COMMUNITY)
Admission: EM | Admit: 2017-01-28 | Discharge: 2017-01-28 | Disposition: A | Discharge status: Home / Self Care | Payer: Medicare Other | Attending: Emergency Medicine | Admitting: Emergency Medicine

## 2017-01-28 ENCOUNTER — Emergency Department (HOSPITAL_COMMUNITY): Payer: Medicare Other

## 2017-01-28 DIAGNOSIS — I509 Heart failure, unspecified: Secondary | ICD-10-CM | POA: Insufficient documentation

## 2017-01-28 DIAGNOSIS — R079 Chest pain, unspecified: Secondary | ICD-10-CM | POA: Diagnosis not present

## 2017-01-28 DIAGNOSIS — Z7982 Long term (current) use of aspirin: Secondary | ICD-10-CM | POA: Diagnosis not present

## 2017-01-28 DIAGNOSIS — Z794 Long term (current) use of insulin: Secondary | ICD-10-CM | POA: Diagnosis not present

## 2017-01-28 DIAGNOSIS — I259 Chronic ischemic heart disease, unspecified: Secondary | ICD-10-CM | POA: Diagnosis not present

## 2017-01-28 DIAGNOSIS — K2100 Gastro-esophageal reflux disease with esophagitis, without bleeding: Secondary | ICD-10-CM

## 2017-01-28 DIAGNOSIS — E1140 Type 2 diabetes mellitus with diabetic neuropathy, unspecified: Secondary | ICD-10-CM | POA: Insufficient documentation

## 2017-01-28 DIAGNOSIS — Z79899 Other long term (current) drug therapy: Secondary | ICD-10-CM | POA: Diagnosis not present

## 2017-01-28 DIAGNOSIS — Z9861 Coronary angioplasty status: Secondary | ICD-10-CM | POA: Diagnosis not present

## 2017-01-28 DIAGNOSIS — R072 Precordial pain: Secondary | ICD-10-CM | POA: Diagnosis not present

## 2017-01-28 LAB — HEPATIC FUNCTION PANEL
ALBUMIN: 4 g/dL (ref 3.5–5.0)
ALT: 14 U/L (ref 14–54)
AST: 20 U/L (ref 15–41)
Alkaline Phosphatase: 96 U/L (ref 38–126)
BILIRUBIN DIRECT: 0.1 mg/dL (ref 0.1–0.5)
BILIRUBIN TOTAL: 0.3 mg/dL (ref 0.3–1.2)
Indirect Bilirubin: 0.2 mg/dL — ABNORMAL LOW (ref 0.3–0.9)
Total Protein: 6.3 g/dL — ABNORMAL LOW (ref 6.5–8.1)

## 2017-01-28 LAB — CBC
HCT: 40.6 % (ref 36.0–46.0)
Hemoglobin: 12.9 g/dL (ref 12.0–15.0)
MCH: 26.6 pg (ref 26.0–34.0)
MCHC: 31.8 g/dL (ref 30.0–36.0)
MCV: 83.7 fL (ref 78.0–100.0)
Platelets: 162 10*3/uL (ref 150–400)
RBC: 4.85 MIL/uL (ref 3.87–5.11)
RDW: 13.1 % (ref 11.5–15.5)
WBC: 6.2 10*3/uL (ref 4.0–10.5)

## 2017-01-28 LAB — BASIC METABOLIC PANEL
Anion gap: 7 (ref 5–15)
BUN: 19 mg/dL (ref 6–20)
CHLORIDE: 104 mmol/L (ref 101–111)
CO2: 26 mmol/L (ref 22–32)
Calcium: 9.4 mg/dL (ref 8.9–10.3)
Creatinine, Ser: 0.99 mg/dL (ref 0.44–1.00)
GFR calc Af Amer: >60 mL/min (ref >60)
GFR calc non Af Amer: 54 mL/min — ABNORMAL LOW (ref >60)
GLUCOSE: 142 mg/dL — AB (ref 65–99)
POTASSIUM: 4.4 mmol/L (ref 3.5–5.1)
Sodium: 137 mmol/L (ref 135–145)

## 2017-01-28 LAB — TROPONIN I: Troponin I: <0.03 ng/mL (ref <0.03)

## 2017-01-28 MED ORDER — NITROGLYCERIN 0.4 MG SL SUBL: 0.4000 mg | SUBLINGUAL_TABLET | SUBLINGUAL | Status: DC | PRN

## 2017-01-28 MED ORDER — GI COCKTAIL ~~LOC~~
30.0000 mL | Freq: Once | ORAL | Status: AC
  Administered 2017-01-28: 30 mL via ORAL
  Filled 2017-01-28: qty 30

## 2017-01-28 MED ORDER — PANTOPRAZOLE SODIUM 20 MG PO TBEC
20.0000 mg | DELAYED_RELEASE_TABLET | Freq: Every day | ORAL | 0 refills | Status: DC
Start: 2017-01-28 — End: 2017-03-31

## 2017-01-28 NOTE — ED Triage Notes (Signed)
Pt reports chest pain at times with intermittent nausea.  States at times is just feels like a funny feeling in chest and at others is actual pain.  Also c/o shortness of breath.

## 2017-01-28 NOTE — ED Provider Notes (Signed)
[facility] EMERGENCY DEPARTMENT Provider Note   CSN: [id number] Arrival date & time: 01/28/17  1345     History   Chief Complaint Chief Complaint  Patient presents with  . Chest Pain    HPI Selena Travis is a 76 y.o. female.  Patient complains of burning in her chest off and on today.  She states she has never had this symptom before no shortness of breath no sweating it is not related to exertion   The history is provided by the patient.  Chest Pain   This is a new problem. The current episode started 12 to 24 hours ago. The problem occurs rarely. The problem has been resolved. The pain is associated with rest. The pain is present in the substernal region. The pain is at a severity of 3/10. The pain is mild. The quality of the pain is described as burning. Pertinent negatives include no abdominal pain, no back pain, no cough and no headaches.  Pertinent negatives for past medical history include no seizures.    Past Medical History:  Diagnosis Date  . CHF (congestive heart failure) ([place])   . Depression   . Diabetes mellitus without complication [facility])     Patient Active Problem List   Diagnosis Date Noted  . Tinnitus 01/26/2017  . Osteoarthritis 01/26/2017  . Diabetes mellitus ([place]) 10/25/2016  . Hyperlipidemia associated with type 2 diabetes mellitus ([place]) 10/25/2016  . CAD (coronary artery disease) 10/25/2016  . 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 Diabetic neuropathy ([place]) 10/25/2016  . Primary insomnia 10/25/2016    Past Surgical History:  Procedure Laterality Date  . ABDOMINAL HYSTERECTOMY    . CARDIAC SURGERY     Blockages  . carpel tunnel bilaterally    . CATARACT EXTRACTION, BILATERAL    . great toe surgery Left   . SHOULDER ARTHROSCOPY Right    Spur removal  . TUBAL LIGATION      OB History    No data available       Home Medications    Prior to Admission medications   Medication Sig Start Date End Date Taking? Authorizing Provider  aspirin EC  81 MG tablet Take 81 mg by mouth daily.   Yes [provider]  escitalopram (LEXAPRO) 10 MG tablet Take 1 tablet (10 mg total) daily by mouth. 01/26/17  Yes Hawks, Christy A, FNP  fluticasone (FLONASE) 50 MCG/ACT nasal spray Place 2 sprays daily into both nostrils. 01/26/17  Yes Hawks, Christy A, FNP  glipiZIDE (GLUCOTROL) 10 MG tablet Take 1 tablet (10 mg total) by mouth daily before breakfast. 12/13/16  Yes Hawks, Christy A, FNP  insulin aspart (NOVOLOG) 100 UNIT/ML injection Inject 1-10 Units as needed into the skin for high blood sugar (patient takes per sliding scale on an as-needed basis, with a maximum dose of 10 units).    Yes [provider]  insulin detemir (LEVEMIR) 100 UNIT/ML injection Inject 0.54 mLs (54 Units total) into the skin 2 (two) times daily. 11/03/16  Yes Hawks, Alyse Low A, FNP  meloxicam (MOBIC) 7.5 MG tablet Take 1 tablet (7.5 mg total) daily by mouth. 01/26/17  Yes Hawks, Christy A, FNP  metFORMIN (GLUCOPHAGE) 1000 MG tablet Take 1 tablet (1,000 mg total) by mouth 2 (two) times daily with a meal. 12/13/16  Yes Hawks, Christy A, FNP  naproxen sodium (ALEVE) 220 MG tablet Take 220 mg daily as needed by mouth.   Yes [provider]  nitroGLYCERIN (NITROSTAT) 0.4 MG SL tablet Place  1 tablet (0.4 mg total) under the tongue every 5 (five) minutes as needed for chest pain. 12/13/16  Yes Hawks, Christy A, FNP  pravastatin (PRAVACHOL) 40 MG tablet Take 1 tablet (40 mg total) by mouth daily. 12/13/16  Yes Hawks, Christy A, FNP  pantoprazole (PROTONIX) 20 MG tablet Take 1 tablet (20 mg total) daily by mouth. 01/28/17   Milton Ferguson, MD    Family History Family History  Problem Relation Age of Onset  . Diabetes Mother   . Heart disease Father   . Cancer Sister     Social History Social History   Tobacco Use  . Smoking status: Never Smoker  . Smokeless tobacco: Never Used  Substance Use Topics  . Alcohol use: No  . Drug use: No     Allergies     Patient has no known allergies.   Review of Systems Review of Systems  Constitutional: Negative for appetite change and fatigue.  HENT: Negative for congestion, ear discharge and sinus pressure.   Eyes: Negative for discharge.  Respiratory: Negative for cough.   Cardiovascular: Positive for chest pain.  Gastrointestinal: Negative for abdominal pain and diarrhea.  Genitourinary: Negative for frequency and hematuria.  Musculoskeletal: Negative for back pain.  Skin: Negative for rash.  Neurological: Negative for seizures and headaches.  Psychiatric/Behavioral: Negative for hallucinations.     Physical Exam Updated Vital Signs BP 128/62   Pulse 79   Temp 97.9 F (36.6 C) (Oral)   Resp 18   Ht 5\' 2"  (1.575 m)   Wt 92.1 kg (203 lb)   SpO2 97%   BMI 37.13 kg/m   Physical Exam  Constitutional: She is oriented to person, place, and time. She appears well-developed.  HENT:  Head: Normocephalic.  Eyes: Conjunctivae and EOM are normal. No scleral icterus.  Neck: Neck supple. No thyromegaly present.  Cardiovascular: Normal rate and regular rhythm. Exam reveals no gallop and no friction rub.  No murmur heard. Pulmonary/Chest: No stridor. She has no wheezes. She has no rales. She exhibits no tenderness.  Abdominal: She exhibits no distension. There is no tenderness. There is no rebound.  Musculoskeletal: Normal range of motion. She exhibits no edema.  Lymphadenopathy:    She has no cervical adenopathy.  Neurological: She is oriented to person, place, and time. She exhibits normal muscle tone. Coordination normal.  Skin: No rash noted. No erythema.  Psychiatric: She has a normal mood and affect. Her behavior is normal.     ED Treatments / Results  Labs (all labs ordered are listed, but only abnormal results are displayed) Labs Reviewed  BASIC METABOLIC PANEL - Abnormal; Notable for the following components:      Result Value   Glucose, Bld 142 (*)    GFR calc non Af Amer  54 (*)    All other components within normal limits  HEPATIC FUNCTION PANEL - Abnormal; Notable for the following components:   Total Protein 6.3 (*)    Indirect Bilirubin 0.2 (*)    All other components within normal limits  CBC  TROPONIN I  TROPONIN I    EKG  EKG Interpretation  Date/Time:  Saturday January 28 2017 13:52:46 EST Ventricular Rate:  82 PR Interval:    QRS Duration: 103 QT Interval:  365 QTC Calculation: 427 R Axis:   80 Text Interpretation:  Sinus rhythm Baseline wander in lead(s) V1 Confirmed by Milton Ferguson [phone]) on 01/28/2017 2:47:54 PM Also confirmed by Milton Ferguson [phone])  on 01/28/2017 3:57:59  PM       Radiology Dg Chest 2 View  Result Date: 01/28/2017 CLINICAL DATA:  Chest pain, intermittent nausea EXAM: CHEST  2 VIEW COMPARISON:  Chest x-ray dated 02/09/2015. FINDINGS: Heart size and mediastinal contours are stable. Median sternotomy wires appear intact and stable alignment, presumed CABG. Left chest wall pacemaker leads appear stable in position. Lungs are clear. No pleural effusion or pneumothorax seen. Mild degenerative spurring throughout the kyphotic thoracolumbar spine. No acute or suspicious osseous finding. IMPRESSION: No active cardiopulmonary disease. No evidence of pneumonia or pulmonary edema. Electronically Signed   By: Franki Cabot M.D.   On: 01/28/2017 14:34    Procedures Procedures (including critical care time)  Medications Ordered in ED Medications  nitroGLYCERIN (NITROSTAT) SL tablet 0.4 mg (not administered)  gi cocktail (Maalox,Lidocaine,Donnatal) (30 mLs Oral Given 01/28/17 1613)     Initial Impression / Assessment and Plan / ED Course  I have reviewed the triage vital signs and the nursing notes.  Pertinent labs & imaging results that were available during my care of the patient were reviewed by me and considered in my medical decision making (see chart for details).     Patient's discomfort was helped by GI  cocktail.  All her labs are unremarkable including 2 troponins.  Chest x-ray unremarkable.  EKG shows no acute changes.  Suspect GERD.  Patient will be placed on Protonix but she will follow-up with her cardiologist next week or return if problems  Final Clinical Impressions(s) / ED Diagnoses   Final diagnoses:  Gastroesophageal reflux disease with esophagitis    ED Discharge Orders        Ordered    pantoprazole (PROTONIX) 20 MG tablet  Daily     01/28/17 1813       Milton Ferguson, MD 01/28/17 1818

## 2017-01-28 NOTE — Discharge Instructions (Signed)
Follow-up with your heart doctor in the next week and return if any problems

## 2017-01-30 ENCOUNTER — Other Ambulatory Visit: Payer: Self-pay | Admitting: Family

## 2017-01-30 ENCOUNTER — Telehealth: Payer: Self-pay | Admitting: Cardiovascular Disease

## 2017-01-30 DIAGNOSIS — G8929 Other chronic pain: Secondary | ICD-10-CM

## 2017-01-30 DIAGNOSIS — E785 Hyperlipidemia, unspecified: Principal | ICD-10-CM

## 2017-01-30 DIAGNOSIS — M19011 Primary osteoarthritis, right shoulder: Secondary | ICD-10-CM

## 2017-01-30 DIAGNOSIS — E1165 Type 2 diabetes mellitus with hyperglycemia: Secondary | ICD-10-CM

## 2017-01-30 DIAGNOSIS — Z794 Long term (current) use of insulin: Secondary | ICD-10-CM

## 2017-01-30 DIAGNOSIS — M25511 Pain in right shoulder: Secondary | ICD-10-CM

## 2017-01-30 DIAGNOSIS — E1169 Type 2 diabetes mellitus with other specified complication: Secondary | ICD-10-CM

## 2017-01-30 MED ORDER — INSULIN DEGLUDEC 200 UNIT/ML ~~LOC~~ SOPN: 80.0000 [IU] | PEN_INJECTOR | Freq: Every day | SUBCUTANEOUS | 1 refills | Status: DC

## 2017-02-01 NOTE — Telephone Encounter (Signed)
Created in error

## 2017-02-13 ENCOUNTER — Encounter: Payer: Medicare Other | Admitting: Internal Medicine

## 2017-02-13 ENCOUNTER — Encounter: Payer: Self-pay | Admitting: Internal Medicine

## 2017-03-10 ENCOUNTER — Ambulatory Visit (INDEPENDENT_AMBULATORY_CARE_PROVIDER_SITE_OTHER): Payer: Medicare Other | Admitting: Family

## 2017-03-10 ENCOUNTER — Encounter: Payer: Self-pay | Admitting: Family

## 2017-03-10 DIAGNOSIS — I25708 Atherosclerosis of coronary artery bypass graft(s), unspecified, with other forms of angina pectoris: Secondary | ICD-10-CM

## 2017-03-10 DIAGNOSIS — F411 Generalized anxiety disorder: Secondary | ICD-10-CM

## 2017-03-10 DIAGNOSIS — F321 Major depressive disorder, single episode, moderate: Secondary | ICD-10-CM | POA: Diagnosis not present

## 2017-03-10 MED ORDER — BUSPIRONE HCL 10 MG PO TABS: 10.0000 mg | ORAL_TABLET | Freq: Three times a day (TID) | ORAL | 1 refills | Status: DC | PRN

## 2017-03-10 MED ORDER — ESCITALOPRAM OXALATE 20 MG PO TABS: 20.0000 mg | ORAL_TABLET | Freq: Every day | ORAL | 5 refills | Status: DC

## 2017-03-10 NOTE — Patient Instructions (Signed)

## 2017-03-10 NOTE — Progress Notes (Signed)
   Subjective:    Patient ID: Selena Travis, female    DOB: [birth year], 76 y.o.   MRN: [id number]  PT presents to the office today to recheck GAD and Depression. PT states she feels like it has not helped at all. PT currently taking Lexapro 10 mg.  Anxiety  Presents for follow-up visit. Symptoms include decreased concentration, excessive worry, irritability, nervous/anxious behavior and restlessness. Symptoms occur most days. The severity of symptoms is moderate. The quality of sleep is good.   Her past medical history is significant for depression.  Depression       The patient presents with depression.  This is a chronic problem.  The current episode started more than 1 year ago.   The onset quality is gradual.   The problem occurs intermittently.  The problem has been waxing and waning since onset.  Associated symptoms include decreased concentration, irritable, restlessness and sad.  Associated symptoms include no helplessness and no hopelessness.  Past treatments include SSRIs - Selective serotonin reuptake inhibitors.  Compliance with treatment is good.  Past medical history includes anxiety and depression.       Review of Systems  Constitutional: Positive for irritability.  Psychiatric/Behavioral: Positive for decreased concentration and depression. The patient is nervous/anxious.   All other systems reviewed and are negative.      Objective:   Physical Exam  Constitutional: She is oriented to person, place, and time. She appears well-developed and well-nourished. She is irritable. No distress.  HENT:  Head: Normocephalic.  Cardiovascular: Normal rate, regular rhythm, normal heart sounds and intact distal pulses.  No murmur heard. Pulmonary/Chest: Effort normal and breath sounds normal. No respiratory distress. She has no wheezes.  Abdominal: Soft. Bowel sounds are normal. She exhibits no distension. There is no tenderness.  Musculoskeletal: Normal range of motion. She  exhibits no edema or tenderness.  Neurological: She is alert and oriented to person, place, and time.  Skin: Skin is warm and dry.  Psychiatric: She has a normal mood and affect. Her behavior is normal. Judgment and thought content normal.  Vitals reviewed.   BP 122/74   Pulse 85   Temp (!) 97.3 F (36.3 C) (Oral)   Ht 5\' 2"  (1.575 m)   Wt 203 lb 3.2 oz (92.2 kg)   BMI 37.17 kg/m     Assessment & Plan:  1. GAD (generalized anxiety disorder) - escitalopram (LEXAPRO) 20 MG tablet; Take 1 tablet (20 mg total) by mouth daily.  Dispense: 30 tablet; Refill: 5 - busPIRone (BUSPAR) 10 MG tablet; Take 1 tablet (10 mg total) by mouth 3 (three) times daily as needed.  Dispense: 90 tablet; Refill: 1  2. Depression, major, single episode, moderate (HCC) - escitalopram (LEXAPRO) 20 MG tablet; Take 1 tablet (20 mg total) by mouth daily.  Dispense: 30 tablet; Refill: 5 - busPIRone (BUSPAR) 10 MG tablet; Take 1 tablet (10 mg total) by mouth 3 (three) times daily as needed.  Dispense: 90 tablet; Refill: 1  Lexapro increased to 20 mg from 10 mg  Stress management  Adverse effects discussed  RTO in 6 weeks for recheck  Evelina Dun, FNP

## 2017-03-13 ENCOUNTER — Encounter: Payer: Self-pay | Admitting: "Endocrinology

## 2017-03-13 ENCOUNTER — Ambulatory Visit (INDEPENDENT_AMBULATORY_CARE_PROVIDER_SITE_OTHER): Payer: Medicare Other | Admitting: "Endocrinology

## 2017-03-13 VITALS — BP 124/84 | HR 85 | Ht 62.0 in | Wt 204.0 lb

## 2017-03-13 DIAGNOSIS — E1159 Type 2 diabetes mellitus with other circulatory complications: Secondary | ICD-10-CM

## 2017-03-13 DIAGNOSIS — E782 Mixed hyperlipidemia: Secondary | ICD-10-CM | POA: Insufficient documentation

## 2017-03-13 DIAGNOSIS — I25708 Atherosclerosis of coronary artery bypass graft(s), unspecified, with other forms of angina pectoris: Secondary | ICD-10-CM | POA: Diagnosis not present

## 2017-03-13 DIAGNOSIS — Z6837 Body mass index (BMI) 37.0-37.9, adult: Secondary | ICD-10-CM | POA: Diagnosis not present

## 2017-03-13 DIAGNOSIS — E669 Obesity, unspecified: Secondary | ICD-10-CM | POA: Insufficient documentation

## 2017-03-13 NOTE — Patient Instructions (Signed)

## 2017-03-13 NOTE — Progress Notes (Signed)
Consult Note       03/13/2017, 1:01 PM   Subjective:    Patient ID: Selena Travis, female    DOB: 28-Jan-1941.  Lagena Strand is being seen in consultation for management of currently uncontrolled symptomatic diabetes requested by  Sharion Balloon, FNP.   Past Medical History:  Diagnosis Date  . CHF (congestive heart failure) ([place])   . Depression   . Diabetes mellitus without complication [facility])    Past Surgical History:  Procedure Laterality Date  . ABDOMINAL HYSTERECTOMY    . CARDIAC SURGERY     Blockages  . carpel tunnel bilaterally    . CATARACT EXTRACTION, BILATERAL    . great toe surgery Left   . SHOULDER ARTHROSCOPY Right    Spur removal  . TUBAL LIGATION     Social History   Socioeconomic History  . Marital status: Widowed    Spouse name: None  . Number of children: None  . Years of education: None  . Highest education level: None  Social Needs  . Financial resource strain: None  . Food insecurity - worry: None  . Food insecurity - inability: None  . Transportation needs - medical: None  . Transportation needs - non-medical: None  Occupational History  . None  Tobacco Use  . Smoking status: Never Smoker  . Smokeless tobacco: Never Used  Substance and Sexual Activity  . Alcohol use: No  . Drug use: No  . Sexual activity: Yes  Other Topics Concern  . None  Social History Narrative  . None   Outpatient Encounter Medications as of 03/13/2017  Medication Sig  . Insulin Detemir (LEVEMIR FLEXTOUCH) 100 UNIT/ML Pen Inject 60 Units into the skin at bedtime.  Marland Kitchen aspirin EC 81 MG tablet Take 81 mg by mouth daily.  . busPIRone (BUSPAR) 10 MG tablet Take 1 tablet (10 mg total) by mouth 3 (three) times daily as needed.  Marland Kitchen escitalopram (LEXAPRO) 20 MG tablet Take 1 tablet (20 mg total) by mouth daily.  . fluticasone (FLONASE) 50 MCG/ACT nasal spray Place 2 sprays daily into  both nostrils.  . insulin aspart (NOVOLOG) 100 UNIT/ML injection Inject 5-11 Units into the skin 3 (three) times daily before meals.  . meloxicam (MOBIC) 7.5 MG tablet Take 1 tablet (7.5 mg total) daily by mouth. (Patient not taking: Reported on 03/10/2017)  . metFORMIN (GLUCOPHAGE) 1000 MG tablet Take 1 tablet (1,000 mg total) by mouth 2 (two) times daily with a meal.  . naproxen sodium (ALEVE) 220 MG tablet Take 220 mg daily as needed by mouth.  . nitroGLYCERIN (NITROSTAT) 0.4 MG SL tablet Place 1 tablet (0.4 mg total) under the tongue every 5 (five) minutes as needed for chest pain.  . pantoprazole (PROTONIX) 20 MG tablet Take 1 tablet (20 mg total) daily by mouth.  . pravastatin (PRAVACHOL) 40 MG tablet Take 1 tablet (40 mg total) by mouth daily.  . [DISCONTINUED] glipiZIDE (GLUCOTROL) 10 MG tablet Take 1 tablet (10 mg total) by mouth daily before breakfast.  . [DISCONTINUED] Insulin Degludec (TRESIBA FLEXTOUCH) 200 UNIT/ML SOPN Inject 80 Units daily into the skin.   No  facility-administered encounter medications on file as of 03/13/2017.     ALLERGIES: No Known Allergies  VACCINATION STATUS:  There is no immunization history on file for this patient.  Diabetes  She presents for her initial diabetic visit. She has type 2 diabetes mellitus. Onset time: She was diagnosed at approximate age of 76 years. Her disease course has been worsening. There are no hypoglycemic associated symptoms. Pertinent negatives for hypoglycemia include no confusion, headaches, pallor or seizures. Associated symptoms include blurred vision, fatigue, polydipsia and polyuria. Pertinent negatives for diabetes include no chest pain and no polyphagia. There are no hypoglycemic complications. Symptoms are worsening. 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 Risk factors for coronary artery disease include diabetes mellitus, dyslipidemia, family history, sedentary lifestyle, tobacco exposure,  post-menopausal and obesity. Current diabetic treatment includes insulin injections and oral agent (dual therapy) (She is currently on Levemir 54 units twice a day, NovoLog 3-8 units 3 times a day before meals, along with glipizide 10 mg by mouth twice a day, metformin 1000 mg by mouth twice a day.). Her weight is stable. She is following a generally unhealthy diet. When asked about meal planning, she reported none. She has not had a previous visit with a dietitian. She never participates in exercise. (She did not bring any meter nor logs to review today. She admits that she does not monitor blood glucose regularly.) An ACE inhibitor/angiotensin II receptor blocker is being taken. She does not see a podiatrist.Eye exam is not current.  Hyperlipidemia  This is a chronic problem. The current episode started more than 1 year ago. The problem is controlled. Exacerbating diseases include diabetes. Pertinent negatives include no chest pain, myalgias or shortness of breath. Current antihyperlipidemic treatment includes statins. Risk factors for coronary artery disease include dyslipidemia, diabetes mellitus, a sedentary lifestyle and obesity.   Review of Systems  Constitutional: Positive for fatigue. Negative for chills, fever and unexpected weight change.  HENT: Negative for trouble swallowing and voice change.   Eyes: Positive for blurred vision. Negative for visual disturbance.  Respiratory: Negative for cough, shortness of breath and wheezing.   Cardiovascular: Negative for chest pain, palpitations and leg swelling.  Gastrointestinal: Negative for diarrhea, nausea and vomiting.  Endocrine: Positive for polydipsia and polyuria. Negative for cold intolerance, heat intolerance and polyphagia.  Musculoskeletal: Negative for arthralgias and myalgias.  Skin: Negative for color change, pallor, rash and wound.  Neurological: Negative for seizures and headaches.  Psychiatric/Behavioral: Negative for confusion and  suicidal ideas.    Objective:    BP 124/84   Pulse 85   Ht 5\' 2"  (1.575 m)   Wt 204 lb (92.5 kg)   BMI 37.31 kg/m   Wt Readings from Last 3 Encounters:  03/13/17 204 lb (92.5 kg)  03/10/17 203 lb 3.2 oz (92.2 kg)  01/28/17 203 lb (92.1 kg)     Physical Exam  Constitutional: She is oriented to person, place, and time. She appears well-developed.  HENT:  Head: Normocephalic and atraumatic.  Eyes: EOM are normal.  Neck: Normal range of motion. Neck supple. No tracheal deviation present. No thyromegaly present.  Cardiovascular: Normal rate and regular rhythm.  Pulmonary/Chest: Effort normal and breath sounds normal.  Abdominal: Soft. Bowel sounds are normal. There is no tenderness. There is no guarding.  Musculoskeletal: Normal range of motion. She exhibits no edema.  Neurological: She is alert and oriented to person, place, and time. She has normal reflexes. No cranial nerve deficit. Coordination normal.  Skin: Skin is  warm and dry. No rash noted. No erythema. No pallor.  Psychiatric: She has a normal mood and affect. Judgment normal.    CMP ( most recent) CMP     Component Value Date/Time   NA 137 01/28/2017 1400   NA 138 01/26/2017 1433   K 4.4 01/28/2017 1400   CL 104 01/28/2017 1400   CO2 26 01/28/2017 1400   GLUCOSE 142 (H) 01/28/2017 1400   BUN 19 01/28/2017 1400   BUN 19 01/26/2017 1433   CREATININE 0.99 01/28/2017 1400   CALCIUM 9.4 01/28/2017 1400   PROT 6.3 (L) 01/28/2017 1407   PROT 5.9 (L) 01/26/2017 1433   ALBUMIN 4.0 01/28/2017 1407   ALBUMIN 4.1 01/26/2017 1433   AST 20 01/28/2017 1407   ALT 14 01/28/2017 1407   ALKPHOS 96 01/28/2017 1407   BILITOT 0.3 01/28/2017 1407   BILITOT <0.2 01/26/2017 1433   GFRNONAA 54 (L) 01/28/2017 1400   GFRAA >60 01/28/2017 1400    Lipid Panel ( most recent) Lipid Panel     Component Value Date/Time   CHOL 135 01/26/2017 1433   TRIG 138 01/26/2017 1433   HDL 47 01/26/2017 1433   CHOLHDL 2.9 01/26/2017 1433    LDLCALC 60 01/26/2017 1433      Assessment & Plan:   1. DM type 2 causing vascular disease ([place])   - Tahja Liao has currently uncontrolled symptomatic type 2 DM since  76 years of age,  with most recent A1c of 10.3%. Recent labs reviewed.  -her diabetes is complicated by retinopathy, coronary artery disease/CHF, and Sabryn Preslar remains at a high risk for more acute and chronic complications which include CAD, CVA, CKD, retinopathy, and neuropathy. These are all discussed in detail with the patient.  - I have counseled her on diet management and weight loss, by adopting a carbohydrate restricted/protein rich diet.  - Suggestion is made for her to avoid simple carbohydrates  from her diet including Cakes, Sweet Desserts, Ice Cream, Soda (diet and regular), Sweet Tea, Candies, Chips, Cookies, Store Bought Juices, Alcohol in Excess of  1-2 drinks a day, Artificial Sweeteners, and "Sugar-free" Products. This will help patient to have stable blood glucose profile and potentially avoid unintended weight gain.  - I encouraged her to switch to  unprocessed or minimally processed complex starch and increased protein intake (animal or plant source), fruits, and vegetables.  - she is advised to stick to a routine mealtimes to eat 3 meals  a day and avoid unnecessary snacks ( to snack only to correct hypoglycemia).   - she will be scheduled with Jearld Fenton, RDN, CDE for individualized diabetes education.  - I have approached her with the following individualized plan to manage diabetes and patient agrees:   - Based on the chronicity of her diabetes type 2 and her presenting glycemic burden, she'll continue to require intensive treatment with basal/bolus insulin. -  I  will proceed to readjust her basal insulin Levemir to 60 units daily at bedtime , and increase her prandial insulin NovoLog to 5 units 3 times a day with meals  for pre-meal BG readings of 90-150mg /dl, plus patient specific  correction dose for unexpected hyperglycemia above 150mg /dl, associated with strict monitoring of glucose 4 times a day-before meals and at bedtime. - Patient is warned not to take insulin without proper monitoring per orders. -Adjustment parameters are given for hypo and hyperglycemia in writing. -Patient is encouraged to call clinic for blood glucose levels less than 70 or  above 300 mg /dl. - I will contmetformin 1000 mg by mouth twice a day, therapeutically suitable for patient . - I will discontinue glipizide, risk outweighs benefit for this patient. - she will be considered for incretin therapy as appropriate next visit. - Patient specific target  A1c;  LDL, HDL, Triglycerides, and  Waist Circumference were discussed in detail.  2) BP/HTN:controlled. patient is not on antihypertensive medications, she is advised to bring all of her medications next visit for reconciliation. 3) Lipids/HPL:    controlled.   Patient is advised to continue statins. 4)  Weight/Diet: CDE Consult will be initiated , exercise, and detailed carbohydrates information provided.  5) Chronic Care/Health Maintenance:  -she  is on Statin medications and  is encouraged to continue to follow up with Ophthalmology, Dentist,  Podiatrist at least yearly or according to recommendations, and advised to  stay away from smoking. I have recommended yearly flu vaccine and pneumonia vaccination at least every 5 years; moderate intensity exercise for up to 150 minutes weekly; and  sleep for at least 7 hours a day.  - I advised patient to maintain close follow up with Sharion Balloon, FNP for primary care needs.  - Time spent with the patient: 1 hour, of which >50% was spent in obtaining information about her symptoms, reviewing her previous labs, evaluations, and treatments, counseling her about hercurrently uncontrolled complicated type 2 diabetes, hyper lipidemia, and developing a plan for long term treatment; her  questions were  answered to her satisfaction.  Follow up plan: - Return in about 1 week (around 03/20/2017) for follow up with meter and logs- no labs.  Glade Lloyd, MD [facility] [address] [place], [place] [address] Phone: [phone]  Fax: [phone]    03/13/2017, 1:01 PM  This note was partially dictated with voice recognition software. Similar sounding words can be transcribed inadequately or may not  be corrected upon review.

## 2017-03-17 ENCOUNTER — Ambulatory Visit: Payer: Medicare Other | Admitting: Cardiovascular Disease

## 2017-03-31 ENCOUNTER — Encounter: Payer: Self-pay | Admitting: Cardiovascular Disease

## 2017-03-31 ENCOUNTER — Ambulatory Visit (INDEPENDENT_AMBULATORY_CARE_PROVIDER_SITE_OTHER): Payer: Medicare Other | Admitting: Cardiovascular Disease

## 2017-03-31 VITALS — BP 140/80 | HR 54 | Ht 62.0 in | Wt 205.0 lb

## 2017-03-31 DIAGNOSIS — I25708 Atherosclerosis of coronary artery bypass graft(s), unspecified, with other forms of angina pectoris: Secondary | ICD-10-CM | POA: Diagnosis not present

## 2017-03-31 DIAGNOSIS — R0609 Other forms of dyspnea: Secondary | ICD-10-CM | POA: Diagnosis not present

## 2017-03-31 DIAGNOSIS — I209 Angina pectoris, unspecified: Secondary | ICD-10-CM

## 2017-03-31 DIAGNOSIS — R5383 Other fatigue: Secondary | ICD-10-CM | POA: Diagnosis not present

## 2017-03-31 DIAGNOSIS — E785 Hyperlipidemia, unspecified: Secondary | ICD-10-CM | POA: Diagnosis not present

## 2017-03-31 DIAGNOSIS — Z951 Presence of aortocoronary bypass graft: Secondary | ICD-10-CM

## 2017-03-31 DIAGNOSIS — Z9289 Personal history of other medical treatment: Secondary | ICD-10-CM | POA: Diagnosis not present

## 2017-03-31 DIAGNOSIS — G4730 Sleep apnea, unspecified: Secondary | ICD-10-CM | POA: Diagnosis not present

## 2017-03-31 DIAGNOSIS — R0600 Dyspnea, unspecified: Secondary | ICD-10-CM | POA: Diagnosis not present

## 2017-03-31 NOTE — Progress Notes (Signed)
SUBJECTIVE: The patient presents for follow-up of coronary artery disease with a history of CABG.  She also has an AICD.  This was implanted in [place].  Patient underwent 5-vessel CABG with LIMA to LAD, SVG to 2nd diagonal, SVG to 1st obtuse marginal, and sequential SVG's to right PDA and posterolateral coronary arteries on 10/23/2006.  She had ventricular fibrillation and underwent placement of a dual-chamber Medtronic automatic implantable cardioverter-defibrillator, serial number [id number] on 10/30/2006.  I ordered a Lexiscan Myoview stress test and an echocardiogram at her last visit as she had been experiencing progressive exertional dyspnea and paroxysmal nocturnal dyspnea.  I also ordered a sleep study for sleep disordered breathing.  She did not have any of these tests completed as she went to [place].  She said she knows she needs to have them done.  She presented to the ED on January 28, 2017 with chest pain.  I reviewed all records.  Blood pressure, heart rate, and oxygen saturation were normal. Troponins were normal.  Chest x-ray was unremarkable.  ECG had no acute changes.  She was ultimately given a GI cocktail with relief of symptoms.  She was started on Protonix.  CBC was also normal.  BUN 19, creatinine 0.99.  I personally reviewed the ECG performed on 01/28/17 which demonstrated sinus rhythm with no ischemic ST segment or T wave abnormalities, nor any arrhythmias.  She has been taking Nexium which is helped her GERD.  She still has some exertional dyspnea if she walks too quickly or for long periods of time.  She denies chest pain.  She said she will not have the sleep study done because she cannot tolerate sleeping with the mask.  She did have some dyspnea last night when lying down.  She has also yet to enroll in device clinic.  Review of Systems: As per "subjective", otherwise negative.  No Known Allergies  Current Outpatient Medications  Medication Sig  Dispense Refill  . aspirin EC 81 MG tablet Take 81 mg by mouth daily.    . insulin aspart (NOVOLOG) 100 UNIT/ML injection Inject 5-11 Units into the skin 3 (three) times daily before meals.    . Insulin Detemir (LEVEMIR FLEXTOUCH) 100 UNIT/ML Pen Inject 60 Units into the skin at bedtime.    . metFORMIN (GLUCOPHAGE) 1000 MG tablet Take 1 tablet (1,000 mg total) by mouth 2 (two) times daily with a meal. 180 tablet 1  . naproxen sodium (ALEVE) 220 MG tablet Take 220 mg daily as needed by mouth.    . nitroGLYCERIN (NITROSTAT) 0.4 MG SL tablet Place 1 tablet (0.4 mg total) under the tongue every 5 (five) minutes as needed for chest pain. 50 tablet 3  . pravastatin (PRAVACHOL) 40 MG tablet Take 1 tablet (40 mg total) by mouth daily. 90 tablet 1   No current facility-administered medications for this visit.     Past Medical History:  Diagnosis Date  . CHF (congestive heart failure) ([place])   . Depression   . Diabetes mellitus without complication [facility])     Past Surgical History:  Procedure Laterality Date  . ABDOMINAL HYSTERECTOMY    . CARDIAC SURGERY     Blockages  . carpel tunnel bilaterally    . CATARACT EXTRACTION, BILATERAL    . great toe surgery Left   . SHOULDER ARTHROSCOPY Right    Spur removal  . TUBAL LIGATION      Social History   Socioeconomic History  . Marital status: Widowed  Spouse name: Not on file  . Number of children: Not on file  . Years of education: Not on file  . Highest education level: Not on file  Social Needs  . Financial resource strain: Not on file  . Food insecurity - worry: Not on file  . Food insecurity - inability: Not on file  . Transportation needs - medical: Not on file  . Transportation needs - non-medical: Not on file  Occupational History  . Not on file  Tobacco Use  . Smoking status: Never Smoker  . Smokeless tobacco: Never Used  Substance and Sexual Activity  . Alcohol use: No  . Drug use: No  . Sexual activity: Yes  Other  Topics Concern  . Not on file  Social History Narrative  . Not on file     Vitals:   03/31/17 1335  BP: 140/80  Pulse: (!) 54  SpO2: 97%  Weight: 205 lb (93 kg)  Height: 5\' 2"  (1.575 m)    Wt Readings from Last 3 Encounters:  03/31/17 205 lb (93 kg)  03/13/17 204 lb (92.5 kg)  03/10/17 203 lb 3.2 oz (92.2 kg)     PHYSICAL EXAM General: NAD HEENT: Normal. Neck: No JVD, no thyromegaly. Lungs: Clear to auscultation bilaterally with normal respiratory effort. CV: Regular rate and rhythm, normal S1/S2, no S3/S4, no murmur. No pretibial or periankle edema.  No carotid bruit.   Abdomen: Soft, nontender, no distention.  Neurologic: Alert and oriented.  Psych: Normal affect. Skin: Normal. Musculoskeletal: No gross deformities.    ECG: Most recent ECG reviewed.   Labs: Lab Results  Component Value Date/Time   K 4.4 01/28/2017 02:00 PM   BUN 19 01/28/2017 02:00 PM   BUN 19 01/26/2017 02:33 PM   CREATININE 0.99 01/28/2017 02:00 PM   ALT 14 01/28/2017 02:07 PM   HGB 12.9 01/28/2017 02:00 PM     Lipids: Lab Results  Component Value Date/Time   LDLCALC 60 01/26/2017 02:33 PM   CHOL 135 01/26/2017 02:33 PM   TRIG 138 01/26/2017 02:33 PM   HDL 47 01/26/2017 02:33 PM       ASSESSMENT AND PLAN:  1. Coronary artery disease with history of 5 vessel CABG  (August 2008) with progressive exertional fatigue and dyspnea:  She is currently stable from a symptomatic standpoint but does have episodic dyspnea particularly at night.  It is unclear if these represent anginal symptoms. I encouraged her to obtain the studies which I previously ordered. I will proceed with a nuclear myocardial perfusion imaging study to evaluate for ischemia (Lexiscan Myoview). Continue ASA and pravastatin.   2. Dyslipidemia: Lipids previously reviewed. Ideally, LDL should be lower.  I will monitor.  3. Progressive exertional dyspnea and paroxysmal nocturnal dyspnea: JVP is normal and she has no  leg edema or pulmonary edema on exam.  I previously ordered an echocardiogram to evaluate cardiac structure and function, but she has yet to have it done.  I encouraged her to do so.  4. AICD: I will enroll her in device clinic for routine monitoring and surveillance.  I did this back in October but she did not comply.  She denies ICD shocks.  She had ventricular fibrillation and underwent placement of a dual-chamber Medtronic automatic implantable cardioverter-defibrillator, serial number [id number] on 10/30/2006.  5. Progressive fatigue with snoring (sleep disordered breathing): I ordered a sleep study at her last visit but she did not have it done.  At present, she maintained she will not  have it done because she cannot tolerate sleeping with a mask.  I told her that sleep apnea is an independent risk factor for CVA, MI, and arrhythmias.    Disposition: Follow up 6 months   Kate Sable, M.D., F.A.C.C.

## 2017-03-31 NOTE — Patient Instructions (Signed)
Your physician wants you to follow-up in: 6 months with Dr.Koneswaran You will receive a reminder letter in the mail two months in advance. If you don't receive a letter, please call our office to schedule the follow-up appointment.    Your physician has requested that you have an echocardiogram. Echocardiography is a painless test that uses sound waves to create images of your heart. It provides your doctor with information about the size and shape of your heart and how well your heart's chambers and valves are working. This procedure takes approximately one hour. There are no restrictions for this procedure.  Your physician has requested that you have a lexiscan myoview. For further information please visit [email or web address]. Please follow instruction sheet, as given.   Jarrett Soho will schedule you an apt for pacemaker check     Your physician recommends that you continue on your current medications as directed. Please refer to the Current Medication list given to you today.     Thank you for choosing [facility] !

## 2017-03-31 NOTE — Addendum Note (Signed)
Addended by: Barbarann Ehlers A on: 03/31/2017 02:27 PM   Modules accepted: Orders

## 2017-04-06 ENCOUNTER — Ambulatory Visit (HOSPITAL_COMMUNITY)
Admission: RE | Admit: 2017-04-06 | Discharge: 2017-04-06 | Disposition: A | Discharge status: Home / Self Care | Payer: Medicare Other | Source: Ambulatory Visit | Attending: Internal Medicine | Admitting: Internal Medicine

## 2017-04-06 ENCOUNTER — Encounter (HOSPITAL_COMMUNITY)
Admission: RE | Admit: 2017-04-06 | Discharge: 2017-04-06 | Disposition: A | Discharge status: Home / Self Care | Payer: Medicare Other | Source: Ambulatory Visit | Attending: Cardiovascular Disease | Admitting: Cardiovascular Disease

## 2017-04-06 ENCOUNTER — Encounter (HOSPITAL_COMMUNITY): Payer: Self-pay

## 2017-04-06 ENCOUNTER — Encounter (HOSPITAL_BASED_OUTPATIENT_CLINIC_OR_DEPARTMENT_OTHER)
Admission: RE | Admit: 2017-04-06 | Discharge: 2017-04-06 | Disposition: A | Discharge status: Home / Self Care | Payer: Medicare Other | Source: Ambulatory Visit | Attending: Cardiovascular Disease | Admitting: Cardiovascular Disease

## 2017-04-06 DIAGNOSIS — I080 Rheumatic disorders of both mitral and aortic valves: Secondary | ICD-10-CM | POA: Diagnosis not present

## 2017-04-06 DIAGNOSIS — Z6837 Body mass index (BMI) 37.0-37.9, adult: Secondary | ICD-10-CM | POA: Insufficient documentation

## 2017-04-06 DIAGNOSIS — I25708 Atherosclerosis of coronary artery bypass graft(s), unspecified, with other forms of angina pectoris: Secondary | ICD-10-CM | POA: Insufficient documentation

## 2017-04-06 DIAGNOSIS — R5383 Other fatigue: Secondary | ICD-10-CM

## 2017-04-06 DIAGNOSIS — R0609 Other forms of dyspnea: Secondary | ICD-10-CM

## 2017-04-06 DIAGNOSIS — Z951 Presence of aortocoronary bypass graft: Secondary | ICD-10-CM | POA: Insufficient documentation

## 2017-04-06 DIAGNOSIS — E782 Mixed hyperlipidemia: Secondary | ICD-10-CM | POA: Diagnosis not present

## 2017-04-06 LAB — ECHOCARDIOGRAM COMPLETE
AVLVOTPG: 3 mmHg
CHL CUP RV SYS PRESS: 44 mmHg
E/e' ratio: 11.44
EWDT: 148 ms
FS: 29 % (ref 28–44)
IVS/LV PW RATIO, ED: 1
LA ID, A-P, ES: 44 mm
LA diam end sys: 44 mm
LA diam index: 2.13 cm/m2
LA vol index: 39 mL/m2
LA vol: 80.5 mL
LAVOLA4C: 77.4 mL
LV E/e' medial: 11.44
LV E/e'average: 11.44
LV PW d: 11.5 mm — AB (ref 0.6–1.1)
LV SIMPSON'S DISK: 59
LV TDI E'LATERAL: 8.27
LV TDI E'MEDIAL: 6.09
LV dias vol index: 33 mL/m2
LV dias vol: 69 mL (ref 46–106)
LVELAT: 8.27 cm/s
LVOT SV: 57 mL
LVOT VTI: 22.3 cm
LVOT area: 2.54 cm2
LVOTD: 18 mm
LVOTPV: 90.9 cm/s
LVSYSVOL: 29 mL (ref 14–42)
LVSYSVOLIN: 14 mL/m2
Lateral S' vel: 9.68 cm/s
MV Dec: 148
MV Peak grad: 4 mmHg
MVPKAVEL: 95.3 m/s
MVPKEVEL: 94.6 m/s
P 1/2 time: 364 ms
RV TAPSE: 15.5 mm
Reg peak vel: 300 cm/s
Stroke v: 40 ml
TR max vel: 300 cm/s

## 2017-04-06 LAB — NM MYOCAR MULTI W/SPECT W/WALL MOTION / EF
CHL CUP RESTING HR STRESS: 76 {beats}/min
CSEPPHR: 109 {beats}/min
LV dias vol: 64 mL (ref 46–106)
LVSYSVOL: 30 mL
NUC STRESS TID: 1.18
RATE: 0.26
SDS: 4
SRS: 6
SSS: 10

## 2017-04-06 MED ORDER — TECHNETIUM TC 99M TETROFOSMIN IV KIT
10.0000 | PACK | Freq: Once | INTRAVENOUS | Status: AC | PRN
Start: 2017-04-06 — End: 2017-04-06
  Administered 2017-04-06: 9.8 via INTRAVENOUS

## 2017-04-06 MED ORDER — REGADENOSON 0.4 MG/5ML IV SOLN
INTRAVENOUS | Status: AC
  Administered 2017-04-06: 0.4 mg via INTRAVENOUS
  Filled 2017-04-06: qty 5

## 2017-04-06 MED ORDER — SODIUM CHLORIDE 0.9% FLUSH
INTRAVENOUS | Status: AC
  Administered 2017-04-06: 10 mL via INTRAVENOUS
  Filled 2017-04-06: qty 10

## 2017-04-06 MED ORDER — TECHNETIUM TC 99M TETROFOSMIN IV KIT
30.0000 | PACK | Freq: Once | INTRAVENOUS | Status: AC | PRN
  Administered 2017-04-06: 30 via INTRAVENOUS

## 2017-04-06 NOTE — Progress Notes (Signed)
*  PRELIMINARY RESULTS* Echocardiogram 2D Echocardiogram has been performed.  Samuel Germany 04/06/2017, 11:19 AM

## 2017-05-04 DIAGNOSIS — E113293 Type 2 diabetes mellitus with mild nonproliferative diabetic retinopathy without macular edema, bilateral: Secondary | ICD-10-CM | POA: Diagnosis not present

## 2017-05-04 DIAGNOSIS — Z794 Long term (current) use of insulin: Secondary | ICD-10-CM | POA: Diagnosis not present

## 2017-05-04 DIAGNOSIS — Z7984 Long term (current) use of oral hypoglycemic drugs: Secondary | ICD-10-CM | POA: Diagnosis not present

## 2017-05-04 DIAGNOSIS — H353132 Nonexudative age-related macular degeneration, bilateral, intermediate dry stage: Secondary | ICD-10-CM | POA: Diagnosis not present

## 2017-05-04 LAB — HM DIABETES EYE EXAM

## 2017-05-12 ENCOUNTER — Other Ambulatory Visit: Payer: Self-pay | Admitting: Family

## 2017-05-16 ENCOUNTER — Telehealth: Payer: Self-pay | Admitting: Family

## 2017-05-16 MED ORDER — INSULIN DETEMIR 100 UNIT/ML FLEXPEN: 60.0000 [IU] | PEN_INJECTOR | Freq: Every day | SUBCUTANEOUS | 0 refills | Status: DC

## 2017-05-16 NOTE — Telephone Encounter (Signed)
levemir RX must be for 90 day supply for mail order pharmacy

## 2017-05-16 NOTE — Telephone Encounter (Signed)
LMOVM refill sent to [place] to call office to make follow up appt in next 2 - 3 weeks

## 2017-05-18 ENCOUNTER — Other Ambulatory Visit: Payer: Self-pay | Admitting: Family

## 2017-05-22 ENCOUNTER — Ambulatory Visit (INDEPENDENT_AMBULATORY_CARE_PROVIDER_SITE_OTHER): Payer: Medicare Other | Admitting: Family

## 2017-05-22 ENCOUNTER — Encounter: Payer: Self-pay | Admitting: Family

## 2017-05-22 VITALS — BP 152/81 | HR 75 | Temp 97.1°F | Ht 62.0 in | Wt 201.0 lb

## 2017-05-22 DIAGNOSIS — E669 Obesity, unspecified: Secondary | ICD-10-CM | POA: Diagnosis not present

## 2017-05-22 DIAGNOSIS — Z951 Presence of aortocoronary bypass graft: Secondary | ICD-10-CM

## 2017-05-22 DIAGNOSIS — I25708 Atherosclerosis of coronary artery bypass graft(s), unspecified, with other forms of angina pectoris: Secondary | ICD-10-CM | POA: Diagnosis not present

## 2017-05-22 DIAGNOSIS — F321 Major depressive disorder, single episode, moderate: Secondary | ICD-10-CM

## 2017-05-22 DIAGNOSIS — E785 Hyperlipidemia, unspecified: Secondary | ICD-10-CM

## 2017-05-22 DIAGNOSIS — E1159 Type 2 diabetes mellitus with other circulatory complications: Secondary | ICD-10-CM

## 2017-05-22 DIAGNOSIS — E1169 Type 2 diabetes mellitus with other specified complication: Secondary | ICD-10-CM | POA: Diagnosis not present

## 2017-05-22 LAB — BAYER DCA HB A1C WAIVED: HB A1C (BAYER DCA - WAIVED): 9.2 % — ABNORMAL HIGH (ref <7.0)

## 2017-05-22 NOTE — Progress Notes (Signed)
   Subjective:    Patient ID: Selena Travis, female    DOB: [birth year], 77 y.o.   MRN: [id number]  PT presents to the office today for chronic follow up. Pt has hx of CAD and CABG and is followed by Cardiologists every 6 months. Diabetes  She presents for her follow-up diabetic visit. She has type 2 diabetes mellitus. Her disease course has been worsening. There are no hypoglycemic associated symptoms. Associated symptoms include blurred vision and visual change. Pertinent negatives for diabetes include no foot paresthesias. Symptoms are stable. Diabetic complications include heart disease. Risk factors for coronary artery disease include dyslipidemia, diabetes mellitus, obesity, hypertension, sedentary lifestyle and family history. She is following a diabetic diet. Her breakfast blood glucose range is generally 180-200 mg/dl.  Hyperlipidemia  This is a chronic problem. The current episode started more than 1 year ago. The problem is controlled. Recent lipid tests were reviewed and are normal. Exacerbating diseases include obesity. The current treatment provides moderate improvement of lipids. Risk factors for coronary artery disease include diabetes mellitus, dyslipidemia, family history, hypertension, obesity and a sedentary lifestyle.  Depression         This is a chronic problem.  The current episode started more than 1 year ago.   The onset quality is sudden.   The problem occurs intermittently.  The problem has been waxing and waning since onset.  Past treatments include nothing.  Compliance with treatment is good.     Review of Systems  Eyes: Positive for blurred vision.  Psychiatric/Behavioral: Positive for depression.  All other systems reviewed and are negative.      Objective:   Physical Exam  Constitutional: She is oriented to person, place, and time. She appears well-developed and well-nourished. No distress.  HENT:  Head: Normocephalic and atraumatic.  Right Ear: External  ear normal.  Left Ear: External ear normal.  Nose: Nose normal.  Mouth/Throat: Oropharynx is clear and moist.  Eyes: Pupils are equal, round, and reactive to light.  Neck: Normal range of motion. Neck supple. No thyromegaly present.  Cardiovascular: Normal rate, regular rhythm, normal heart sounds and intact distal pulses.  No murmur heard. Pulmonary/Chest: Effort normal and breath sounds normal. No respiratory distress. She has no wheezes.  Abdominal: Soft. Bowel sounds are normal. She exhibits no distension. There is no tenderness.  Musculoskeletal: Normal range of motion. She exhibits no edema or tenderness.  Neurological: She is alert and oriented to person, place, and time.  Skin: Skin is warm and dry.  Psychiatric: She has a normal mood and affect. Her behavior is normal. Judgment and thought content normal.  Vitals reviewed.     BP (!) 152/81   Pulse 75   Temp (!) 97.1 F (36.2 C)   Ht '5\' 2"'$  (1.575 m)   Wt 201 lb (91.2 kg)   BMI 36.76 kg/m      Assessment & Plan:  1. DM type 2 causing vascular disease (H[facility]) - Bayer DCA Hb A1c Waived - CMP14+EGFR  2. Depression, major, single episode, moderate ([facility]) - CMP14+EGFR  3. Hyperlipidemia associated with type 2 diabetes mellitus ([facility]) - CMP14+EGFR - Lipid panel  4. Coronary artery disease involving coronary bypass graft of native heart with other forms of angina pectoris (HCC)  - CMP14+EGFR  5. Hx of CABG - CMP14+EGFR  6. Obesity (BMI 30-39.9) - CMP14+EGFR   Continue all meds Labs pending Health Maintenance reviewed Diet and exercise encouraged RTO 3 months   Evelina Dun, FNP

## 2017-05-22 NOTE — Patient Instructions (Signed)
Diabetes Mellitus and Nutrition When you have diabetes (diabetes mellitus), it is very important to have healthy eating habits because your blood sugar (glucose) levels are greatly affected by what you eat and drink. Eating healthy foods in the appropriate amounts, at about the same times every day, can help you:  Control your blood glucose.  Lower your risk of heart disease.  Improve your blood pressure.  Reach or maintain a healthy weight.  Every person with diabetes is different, and each person has different needs for a meal plan. Your health care provider may recommend that you work with a diet and nutrition specialist (dietitian) to make a meal plan that is best for you. Your meal plan may vary depending on factors such as:  The calories you need.  The medicines you take.  Your weight.  Your blood glucose, blood pressure, and cholesterol levels.  Your activity level.  Other health conditions you have, such as heart or kidney disease.  How do carbohydrates affect me? Carbohydrates affect your blood glucose level more than any other type of food. Eating carbohydrates naturally increases the amount of glucose in your blood. Carbohydrate counting is a method for keeping track of how many carbohydrates you eat. Counting carbohydrates is important to keep your blood glucose at a healthy level, especially if you use insulin or take certain oral diabetes medicines. It is important to know how many carbohydrates you can safely have in each meal. This is different for every person. Your dietitian can help you calculate how many carbohydrates you should have at each meal and for snack. Foods that contain carbohydrates include:  Bread, cereal, rice, pasta, and crackers.  Potatoes and corn.  Peas, beans, and lentils.  Milk and yogurt.  Fruit and juice.  Desserts, such as cakes, cookies, ice cream, and candy.  How does alcohol affect me? Alcohol can cause a sudden decrease in blood  glucose (hypoglycemia), especially if you use insulin or take certain oral diabetes medicines. Hypoglycemia can be a life-threatening condition. Symptoms of hypoglycemia (sleepiness, dizziness, and confusion) are similar to symptoms of having too much alcohol. If your health care provider says that alcohol is safe for you, follow these guidelines:  Limit alcohol intake to no more than 1 drink per day for nonpregnant women and 2 drinks per day for men. One drink equals 12 oz of beer, 5 oz of wine, or 1 oz of hard liquor.  Do not drink on an empty stomach.  Keep yourself hydrated with water, diet soda, or unsweetened iced tea.  Keep in mind that regular soda, juice, and other mixers may contain a lot of sugar and must be counted as carbohydrates.  What are tips for following this plan? Reading food labels  Start by checking the serving size on the label. The amount of calories, carbohydrates, fats, and other nutrients listed on the label are based on one serving of the food. Many foods contain more than one serving per package.  Check the total grams (g) of carbohydrates in one serving. You can calculate the number of servings of carbohydrates in one serving by dividing the total carbohydrates by 15. For example, if a food has 30 g of total carbohydrates, it would be equal to 2 servings of carbohydrates.  Check the number of grams (g) of saturated and trans fats in one serving. Choose foods that have low or no amount of these fats.  Check the number of milligrams (mg) of sodium in one serving. Most people   should limit total sodium intake to less than 2,300 mg per day.  Always check the nutrition information of foods labeled as "low-fat" or "nonfat". These foods may be higher in added sugar or refined carbohydrates and should be avoided.  Talk to your dietitian to identify your daily goals for nutrients listed on the label. Shopping  Avoid buying canned, premade, or processed foods. These  foods tend to be high in fat, sodium, and added sugar.  Shop around the outside edge of the grocery store. This includes fresh fruits and vegetables, bulk grains, fresh meats, and fresh dairy. Cooking  Use low-heat cooking methods, such as baking, instead of high-heat cooking methods like deep frying.  Cook using healthy oils, such as olive, canola, or sunflower oil.  Avoid cooking with butter, cream, or high-fat meats. Meal planning  Eat meals and snacks regularly, preferably at the same times every day. Avoid going long periods of time without eating.  Eat foods high in fiber, such as fresh fruits, vegetables, beans, and whole grains. Talk to your dietitian about how many servings of carbohydrates you can eat at each meal.  Eat 4-6 ounces of lean protein each day, such as lean meat, chicken, fish, eggs, or tofu. 1 ounce is equal to 1 ounce of meat, chicken, or fish, 1 egg, or 1/4 cup of tofu.  Eat some foods each day that contain healthy fats, such as avocado, nuts, seeds, and fish. Lifestyle   Check your blood glucose regularly.  Exercise at least 30 minutes 5 or more days each week, or as told by your health care provider.  Take medicines as told by your health care provider.  Do not use any products that contain nicotine or tobacco, such as cigarettes and e-cigarettes. If you need help quitting, ask your health care provider.  Work with a counselor or diabetes educator to identify strategies to manage stress and any emotional and social challenges. What are some questions to ask my health care provider?  Do I need to meet with a diabetes educator?  Do I need to meet with a dietitian?  What number can I call if I have questions?  When are the best times to check my blood glucose? Where to find more information:  American Diabetes Association: diabetes.org/food-and-fitness/food  Academy of Nutrition and Dietetics:  www.eatright.org/resources/health/diseases-and-conditions/diabetes  National Institute of Diabetes and Digestive and Kidney Diseases (NIH): www.niddk.nih.gov/health-information/diabetes/overview/diet-eating-physical-activity Summary  A healthy meal plan will help you control your blood glucose and maintain a healthy lifestyle.  Working with a diet and nutrition specialist (dietitian) can help you make a meal plan that is best for you.  Keep in mind that carbohydrates and alcohol have immediate effects on your blood glucose levels. It is important to count carbohydrates and to use alcohol carefully. This information is not intended to replace advice given to you by your health care provider. Make sure you discuss any questions you have with your health care provider. Document Released: 11/25/2004 Document Revised: 04/04/2016 Document Reviewed: 04/04/2016 Elsevier Interactive Patient Education  2018 Elsevier Inc.  

## 2017-05-23 LAB — CMP14+EGFR
A/G RATIO: 2 (ref 1.2–2.2)
ALK PHOS: 117 IU/L (ref 39–117)
ALT: 15 IU/L (ref 0–32)
AST: 17 IU/L (ref 0–40)
Albumin: 4.3 g/dL (ref 3.5–4.8)
BILIRUBIN TOTAL: 0.2 mg/dL (ref 0.0–1.2)
BUN/Creatinine Ratio: 15 (ref 12–28)
BUN: 18 mg/dL (ref 8–27)
CHLORIDE: 104 mmol/L (ref 96–106)
CO2: 26 mmol/L (ref 20–29)
Calcium: 9.4 mg/dL (ref 8.7–10.3)
Creatinine, Ser: 1.18 mg/dL — ABNORMAL HIGH (ref 0.57–1.00)
GFR calc Af Amer: 52 mL/min/{1.73_m2} — ABNORMAL LOW (ref >59)
GFR calc non Af Amer: 45 mL/min/{1.73_m2} — ABNORMAL LOW (ref >59)
Globulin, Total: 2.1 g/dL (ref 1.5–4.5)
Glucose: 212 mg/dL — ABNORMAL HIGH (ref 65–99)
POTASSIUM: 5 mmol/L (ref 3.5–5.2)
Sodium: 142 mmol/L (ref 134–144)
Total Protein: 6.4 g/dL (ref 6.0–8.5)

## 2017-05-23 LAB — LIPID PANEL
CHOL/HDL RATIO: 3.4 ratio (ref 0.0–4.4)
CHOLESTEROL TOTAL: 163 mg/dL (ref 100–199)
HDL: 48 mg/dL (ref >39)
LDL Calculated: 91 mg/dL (ref 0–99)
TRIGLYCERIDES: 121 mg/dL (ref 0–149)
VLDL Cholesterol Cal: 24 mg/dL (ref 5–40)

## 2017-05-24 ENCOUNTER — Other Ambulatory Visit: Payer: Self-pay | Admitting: Family

## 2017-05-24 MED ORDER — SEMAGLUTIDE(0.25 OR 0.5MG/DOS) 2 MG/1.5ML ~~LOC~~ SOPN: PEN_INJECTOR | SUBCUTANEOUS | 1 refills | Status: DC

## 2017-05-29 ENCOUNTER — Encounter: Payer: Self-pay | Admitting: *Deleted

## 2017-05-29 ENCOUNTER — Ambulatory Visit (INDEPENDENT_AMBULATORY_CARE_PROVIDER_SITE_OTHER): Payer: Medicare Other | Admitting: *Deleted

## 2017-05-29 VITALS — BP 111/65 | HR 91 | Ht 62.0 in | Wt 201.0 lb

## 2017-05-29 DIAGNOSIS — Z0000 Encounter for general adult medical examination without abnormal findings: Secondary | ICD-10-CM | POA: Diagnosis not present

## 2017-05-29 NOTE — Patient Instructions (Addendum)
Please review the information given on Advanced Directives, and if you complete these bring our office a copy to file in your medical record.  Please consider joining [employer] at the [employer] and exercise increase your exercise to 3 times per week for 30 minutes.   Your appointment with Dr. Cristopher Peru at [facility] on 06/06/17 at 8:45 am [address] [place], [place] [address] [phone]   Thank you for coming in for your Annual Wellness Visit today!    Preventive Care 52 Years and Older, Female Preventive care refers to lifestyle choices and visits with your health care provider that can promote health and wellness. What does preventive care include?  A yearly physical exam. This is also called an annual well check.  Dental exams once or twice a year.  Routine eye exams. Ask your health care provider how often you should have your eyes checked.  Personal lifestyle choices, including: ? Daily care of your teeth and gums. ? Regular physical activity. ? Eating a healthy diet. ? Avoiding tobacco and drug use. ? Limiting alcohol use. ? Practicing safe sex. ? Taking low-dose aspirin every day. ? Taking vitamin and mineral supplements as recommended by your health care provider. What happens during an annual well check? The services and screenings done by your health care provider during your annual well check will depend on your age, overall health, lifestyle risk factors, and family history of disease. Counseling Your health care provider may ask you questions about your:  Alcohol use.  Tobacco use.  Drug use.  Emotional well-being.  Home and relationship well-being.  Sexual activity.  Eating habits.  History of falls.  Memory and ability to understand (cognition).  Work and work Statistician.  Reproductive health.  Screening You may have the following tests or measurements:  Height, weight, and BMI.  Blood pressure.  Lipid  and cholesterol levels. These may be checked every 5 years, or more frequently if you are over 41 years old.  Skin check.  Lung cancer screening. You may have this screening every year starting at age 33 if you have a 30-pack-year history of smoking and currently smoke or have quit within the past 15 years.  Fecal occult blood test (FOBT) of the stool. You may have this test every year starting at age 3.  Flexible sigmoidoscopy or colonoscopy. You may have a sigmoidoscopy every 5 years or a colonoscopy every 10 years starting at age 44.  Hepatitis C blood test.  Hepatitis B blood test.  Sexually transmitted disease (STD) testing.  Diabetes screening. This is done by checking your blood sugar (glucose) after you have not eaten for a while (fasting). You may have this done every 1-3 years.  Bone density scan. This is done to screen for osteoporosis. You may have this done starting at age 64.  Mammogram. This may be done every 1-2 years. Talk to your health care provider about how often you should have regular mammograms.  Talk with your health care provider about your test results, treatment options, and if necessary, the need for more tests. Vaccines Your health care provider may recommend certain vaccines, such as:  Influenza vaccine. This is recommended every year.  Tetanus, diphtheria, and acellular pertussis (Tdap, Td) vaccine. You may need a Td booster every 10 years.  Varicella vaccine. You may need this if you have not been vaccinated.  Zoster vaccine. You may need this after age 20.  Measles, mumps, and rubella (MMR)  vaccine. You may need at least one dose of MMR if you were born in 1957 or later. You may also need a second dose.  Pneumococcal 13-valent conjugate (PCV13) vaccine. One dose is recommended after age 36.  Pneumococcal polysaccharide (PPSV23) vaccine. One dose is recommended after age 16.  Meningococcal vaccine. You may need this if you have certain  conditions.  Hepatitis A vaccine. You may need this if you have certain conditions or if you travel or work in places where you may be exposed to hepatitis A.  Hepatitis B vaccine. You may need this if you have certain conditions or if you travel or work in places where you may be exposed to hepatitis B.  Haemophilus influenzae type b (Hib) vaccine. You may need this if you have certain conditions.  Talk to your health care provider about which screenings and vaccines you need and how often you need them. This information is not intended to replace advice given to you by your health care provider. Make sure you discuss any questions you have with your health care provider. Document Released: 03/27/2015 Document Revised: 11/18/2015 Document Reviewed: 12/30/2014 Elsevier Interactive Patient Education  [employer].

## 2017-05-30 NOTE — Progress Notes (Signed)
Subjective:   Selena Travis is a 77 y.o. female who presents for an Initial Medicare Annual Wellness Visit.  Selena Travis is retired from [employer].  She lives with her significant other, and their 2 dogs.  She has 2 children and 2 grandchildren that live in [place].  Selena Travis feels her health this year is not as good as it was last year because she has less energy.  She went to the ER once in the past year for chest pain, and it was diagnosed as GERD per patient.  She has had no hospitalizations or surgeries in the past year.  She reports having right lower back pain that is intermittent with certain movements for the past 2 weeks.  No pain with urination or urinary frequency.   Review of Systems    Musculoskeletal - right lower back pain  Cardiac Risk Factors include: diabetes mellitus     Objective:    Today's Vitals   05/29/17 1415  BP: 111/65  Pulse: 91  Weight: 201 lb (91.2 kg)  Height: 5\' 2"  (1.575 m)  PainSc: 6   PainLoc: Back   Body mass index is 36.76 kg/m.  Advanced Directives 05/30/2017 05/29/2017 01/28/2017  Does Patient Have a Medical Advance Directive? - No No  Would patient like information on creating a medical advance directive? Yes (ED - Information included in AVS) - -    Current Medications (verified) Outpatient Encounter Medications as of 05/29/2017  Medication Sig  . aspirin EC 81 MG tablet Take 81 mg by mouth daily.  Marland Kitchen esomeprazole (NEXIUM) 20 MG capsule Take 20 mg by mouth daily.  Marland Kitchen glipiZIDE (GLUCOTROL) 10 MG tablet   . insulin aspart (NOVOLOG) 100 UNIT/ML injection Inject 5-11 Units into the skin 3 (three) times daily before meals. As needed  . Insulin Detemir (LEVEMIR FLEXTOUCH) 100 UNIT/ML Pen Inject 60 Units into the skin at bedtime.  . metFORMIN (GLUCOPHAGE) 1000 MG tablet Take 1 tablet (1,000 mg total) by mouth 2 (two) times daily with a meal.  . naproxen sodium (ALEVE) 220 MG tablet Take 220 mg daily as needed by mouth.  .  nitroGLYCERIN (NITROSTAT) 0.4 MG SL tablet Place 1 tablet (0.4 mg total) under the tongue every 5 (five) minutes as needed for chest pain.  . pravastatin (PRAVACHOL) 40 MG tablet Take 1 tablet (40 mg total) by mouth daily.  . Semaglutide (OZEMPIC) 0.25 or 0.5 MG/DOSE SOPN Inject 0.25 mg into the skin once a week for 28 days, THEN 0.5 mg once a week for 28 days. (Patient not taking: No sig reported)   No facility-administered encounter medications on file as of 05/29/2017.     Allergies (verified) Patient has no known allergies.   History: Past Medical History:  Diagnosis Date  . CHF (congestive heart failure) ([place])   . Depression   . Diabetes mellitus without complication [facility])    Past Surgical History:  Procedure Laterality Date  . ABDOMINAL HYSTERECTOMY    . CARDIAC SURGERY     Blockages  . carpel tunnel bilaterally    . CATARACT EXTRACTION, BILATERAL    . great toe surgery Left   . SHOULDER ARTHROSCOPY Right    Spur removal  . TUBAL LIGATION     Family History  Problem Relation Age of Onset  . Diabetes Mother   . Heart disease Father   . Cancer Sister    Social History   Socioeconomic History  . Marital status: Widowed  Spouse name: None  . Number of children: None  . Years of education: None  . Highest education level: None  Social Needs  . Financial resource strain: None  . Food insecurity - worry: None  . Food insecurity - inability: None  . Transportation needs - medical: None  . Transportation needs - non-medical: None  Occupational History  . None  Tobacco Use  . Smoking status: Former Smoker    Packs/day: 0.20    Years: 30.00    Pack years: 6.00    Types: Cigarettes    Last attempt to quit: 1999    Years since quitting: 20.2  . Smokeless tobacco: Never Used  Substance and Sexual Activity  . Alcohol use: No  . Drug use: No  . Sexual activity: Yes  Other Topics Concern  . None  Social History Narrative  . None    Tobacco  Counseling Counseling given: No   Clinical Intake:     Pain Score: 6                   Activities of Daily Living In your present state of health, do you have any difficulty performing the following activities: 05/29/2017  Hearing? N  Vision? Y  Comment Trouble seeing close up with new glasses, scheduled to see ophthalmology 06/12/17  Difficulty concentrating or making decisions? Y  Comment trouble concentrating at times- not a new change  Walking or climbing stairs? Y  Comment leg weakness when going upstairs   Dressing or bathing? N  Doing errands, shopping? N  Preparing Food and eating ? N  Using the Toilet? N  In the past six months, have you accidently leaked urine? N  Do you have problems with loss of bowel control? N  Managing your Medications? N  Managing your Finances? N  Housekeeping or managing your Housekeeping? N     Immunizations and Health Maintenance  There is no immunization history on file for this patient. Health Maintenance Due  Topic Date Due  . INFLUENZA VACCINE  10/12/2016    Patient Care Team: Sharion Balloon, FNP as PCP - General (Family Medicine)       Assessment:   This is a routine wellness examination for Selena Travis.  Hearing/Vision screen No hearing deficit noted Patient states she saw [facility] for an eye exam recently and got new glasses, she is not completely satisfied with her new glasses, does not see as well as she would like.  She is scheduled for an appointment with Ophthalmology on 06/12/17.    Dietary issues and exercise activities discussed: Current Exercise Habits: The patient does not participate in regular exercise at present, Exercise limited by: cardiac condition(s)  Goals    . Exercise 3x per week (30 min per time)     Join [employer] at the [facility] and go to the exercise 3 times per week for 30 minutes.     Patient usually has 2 meals and snacks in between as needed.  Advised diet consisting of  mostly vegetables, lean proteins, fruits and whole grains in moderation    Depression Screen PHQ 2/9 Scores 05/29/2017 05/22/2017 03/10/2017 01/26/2017 01/16/2017 10/25/2016  PHQ - 2 Score 1 0 1 1 2 2   PHQ- 9 Score - - - - 9 9    Fall Risk Fall Risk  05/29/2017 05/22/2017 01/26/2017 01/16/2017 10/25/2016  Falls in the past year? No No No No No    Is the patient's home free of loose  throw rugs in walkways, pet beds, electrical cords, etc?   yes      Grab bars in the bathroom? no      Handrails on the stairs?   yes      Adequate lighting?   yes    Cognitive Function: MMSE - Mini Mental State Exam 05/29/2017  Orientation to time 5  Orientation to Place 4  Registration 3  Attention/ Calculation 5  Recall 3  Language- name 2 objects 2  Language- repeat 1  Language- follow 3 step command 3  Language- read & follow direction 1  Write a sentence 1  Copy design 1  Total score 29        Screening Tests Health Maintenance  Topic Date Due  . INFLUENZA VACCINE  10/12/2016  . TETANUS/TDAP  06/25/2017 (Originally 05/29/1959)  . DEXA SCAN  05/23/2018 (Originally 05/28/2005)  . PNA vac Low Risk Adult (1 of 2 - PCV13) 10/25/2028 (Originally 05/28/2005)  . FOOT EXAM  10/25/2017  . URINE MICROALBUMIN  10/25/2017  . HEMOGLOBIN A1C  11/22/2017  . OPHTHALMOLOGY EXAM  05/04/2018    Qualifies for Shingles Vaccine? Declined today  Cancer Screenings: Lung: Low Dose CT Chest recommended if Age 59-80 years, 30 pack-year currently smoking OR have quit w/in 15years. Patient does qualify. Breast: Up to date on Mammogram? No scheduled for 07/11/17 Up to date of Bone Density/Dexa? No- declined today Colorectal- declined      Plan:     Review the information given on Advanced Directives, and if you complete these bring our office a copy to file in your medical record Consider joining [employer] at the [facility] and exercise increase exercise to 3 times per week for 30 minutes.  Keep  upcoming appointments with Dr. Lovena Le, Dr. Evette Doffing, the ophthalmologist and for your mammogram.   I have personally reviewed and noted the following in the patient's chart:   . Medical and social history . Use of alcohol, tobacco or illicit drugs  . Current medications and supplements . Functional ability and status . Nutritional status . Physical activity . Advanced directives . List of other physicians . Hospitalizations, surgeries, and ER visits in previous 12 months . Vitals . Screenings to include cognitive, depression, and falls . Referrals and appointments  In addition, I have reviewed and discussed with patient certain preventive protocols, quality metrics, and best practice recommendations. A written personalized care plan for preventive services as well as general preventive health recommendations were provided to patient.     Claudis Giovanelli M, RN   05/30/2017   I have reviewed and agree with the above AWV documentation.   Evelina Dun, FNP

## 2017-06-06 ENCOUNTER — Ambulatory Visit: Payer: Medicare Other | Admitting: Internal Medicine

## 2017-06-12 DIAGNOSIS — H35373 Puckering of macula, bilateral: Secondary | ICD-10-CM | POA: Diagnosis not present

## 2017-06-12 DIAGNOSIS — E113393 Type 2 diabetes mellitus with moderate nonproliferative diabetic retinopathy without macular edema, bilateral: Secondary | ICD-10-CM | POA: Diagnosis not present

## 2017-06-12 DIAGNOSIS — H35031 Hypertensive retinopathy, right eye: Secondary | ICD-10-CM | POA: Diagnosis not present

## 2017-06-12 DIAGNOSIS — H3582 Retinal ischemia: Secondary | ICD-10-CM | POA: Diagnosis not present

## 2017-06-19 ENCOUNTER — Ambulatory Visit: Payer: Self-pay | Admitting: Pediatrics

## 2017-07-07 ENCOUNTER — Ambulatory Visit (INDEPENDENT_AMBULATORY_CARE_PROVIDER_SITE_OTHER): Payer: Medicare Other | Admitting: Pediatrics

## 2017-07-07 ENCOUNTER — Encounter: Payer: Self-pay | Admitting: Pediatrics

## 2017-07-07 VITALS — BP 134/71 | HR 75 | Temp 97.1°F | Ht 62.0 in | Wt 203.8 lb

## 2017-07-07 DIAGNOSIS — M545 Low back pain, unspecified: Secondary | ICD-10-CM

## 2017-07-07 DIAGNOSIS — F339 Major depressive disorder, recurrent, unspecified: Secondary | ICD-10-CM | POA: Diagnosis not present

## 2017-07-07 DIAGNOSIS — I25708 Atherosclerosis of coronary artery bypass graft(s), unspecified, with other forms of angina pectoris: Secondary | ICD-10-CM

## 2017-07-07 DIAGNOSIS — E1159 Type 2 diabetes mellitus with other circulatory complications: Secondary | ICD-10-CM | POA: Diagnosis not present

## 2017-07-07 DIAGNOSIS — G8929 Other chronic pain: Secondary | ICD-10-CM

## 2017-07-07 MED ORDER — EXENATIDE ER 2 MG/0.85ML ~~LOC~~ AUIJ: 2.0000 mg | AUTO-INJECTOR | SUBCUTANEOUS | 3 refills | Status: AC

## 2017-07-07 MED ORDER — MIRTAZAPINE 15 MG PO TABS: 15.0000 mg | ORAL_TABLET | Freq: Every day | ORAL | 2 refills | Status: DC

## 2017-07-07 NOTE — Patient Instructions (Signed)
Back Exercises If you have pain in your back, do these exercises 2-3 times each day or as told by your doctor. When the pain goes away, do the exercises once each day, but repeat the steps more times for each exercise (do more repetitions). If you do not have pain in your back, do these exercises once each day or as told by your doctor. Exercises Single Knee to Chest  Do these steps 3-5 times in a row for each leg: 1. Lie on your back on a firm bed or the floor with your legs stretched out. 2. Bring one knee to your chest. 3. Hold your knee to your chest by grabbing your knee or thigh. 4. Pull on your knee until you feel a gentle stretch in your lower back. 5. Keep doing the stretch for 10-30 seconds. 6. Slowly let go of your leg and straighten it.  Pelvic Tilt  Do these steps 5-10 times in a row: 1. Lie on your back on a firm bed or the floor with your legs stretched out. 2. Bend your knees so they point up to the ceiling. Your feet should be flat on the floor. 3. Tighten your lower belly (abdomen) muscles to press your lower back against the floor. This will make your tailbone point up to the ceiling instead of pointing down to your feet or the floor. 4. Stay in this position for 5-10 seconds while you gently tighten your muscles and breathe evenly.  Cat-Cow  Do these steps until your lower back bends more easily: 1. Get on your hands and knees on a firm surface. Keep your hands under your shoulders, and keep your knees under your hips. You may put padding under your knees. 2. Let your head hang down, and make your tailbone point down to the floor so your lower back is round like the back of a cat. 3. Stay in this position for 5 seconds. 4. Slowly lift your head and make your tailbone point up to the ceiling so your back hangs low (sags) like the back of a cow. 5. Stay in this position for 5 seconds.  Press-Ups  Do these steps 5-10 times in a row: 1. Lie on your belly (face-down)  on the floor. 2. Place your hands near your head, about shoulder-width apart. 3. While you keep your back relaxed and keep your hips on the floor, slowly straighten your arms to raise the top half of your body and lift your shoulders. Do not use your back muscles. To make yourself more comfortable, you may change where you place your hands. 4. Stay in this position for 5 seconds. 5. Slowly return to lying flat on the floor.  Bridges  Do these steps 10 times in a row: 1. Lie on your back on a firm surface. 2. Bend your knees so they point up to the ceiling. Your feet should be flat on the floor. 3. Tighten your butt muscles and lift your butt off of the floor until your waist is almost as high as your knees. If you do not feel the muscles working in your butt and the back of your thighs, slide your feet 1-2 inches farther away from your butt. 4. Stay in this position for 3-5 seconds. 5. Slowly lower your butt to the floor, and let your butt muscles relax.  If this exercise is too easy, try doing it with your arms crossed over your chest. Back Lifts Do these steps 5-10 times in a   row: 1. Lie on your belly (face-down) with your arms at your sides, and rest your forehead on the floor. 2. Tighten the muscles in your legs and your butt. 3. Slowly lift your chest off of the floor while you keep your hips on the floor. Keep the back of your head in line with the curve in your back. Look at the floor while you do this. 4. Stay in this position for 3-5 seconds. 5. Slowly lower your chest and your face to the floor.  Contact a doctor if:  Your back pain gets a lot worse when you do an exercise.  Your back pain does not lessen 2 hours after you exercise. If you have any of these problems, stop doing the exercises. Do not do them again unless your doctor says it is okay. Get help right away if:  You have sudden, very bad back pain. If this happens, stop doing the exercises. Do not do them again  unless your doctor says it is okay. This information is not intended to replace advice given to you by your health care provider. Make sure you discuss any questions you have with your health care provider. Document Released: 04/02/2010 Document Revised: 08/06/2015 Document Reviewed: 04/24/2014 Elsevier Interactive Patient Education  2018 [employer]   

## 2017-07-07 NOTE — Progress Notes (Signed)
  Subjective:   Patient ID: Selena Travis, female    DOB: [birth year], 77 y.o.   MRN: [id number] CC: Establish Care follow up multiple med problems. HPI: Selena Travis is a 77 y.o. female presenting for Establish Care  DM2: recently had BGLs drop to 50s seven times in the past 3 weeks. Taking long acting insulin, glipizide, metformin. Has short acting insulin prescribed, says she isnt using it regularly. Does check her blood sugars, often over 200s. Eats twice a day. Avoiding sugary foods. Didn't start ozempic prescribed last visit.  Mood has been down. Has been on treatment for "nerves" in the past when living in [place].   Back pain for past 4 weeks. No injury that she knows of. Goes across her lower back. No pain down her legs. No weakness in legs. Gait has been normal. Would like to start walking more. Has had similar back pain off and on for years.   Relevant past medical, surgical, family and social history reviewed. Allergies and medications reviewed and updated. Social History   Tobacco Use  Smoking Status Former Smoker  . Packs/day: 0.20  . Years: 30.00  . Pack years: 6.00  . Types: Cigarettes  . Last attempt to quit: 1999  . Years since quitting: 20.3  Smokeless Tobacco Never Used   ROS: Per HPI   Objective:    BP 134/71   Pulse 75   Temp (!) 97.1 F (36.2 C) (Oral)   Ht 5\' 2"  (1.575 m)   Wt 203 lb 12.8 oz (92.4 kg)   BMI 37.28 kg/m   Wt Readings from Last 3 Encounters:  07/07/17 203 lb 12.8 oz (92.4 kg)  05/29/17 201 lb (91.2 kg)  05/22/17 201 lb (91.2 kg)    Gen: NAD, alert, cooperative with exam, NCAT EYES: EOMI, no conjunctival injection, or no icterus ENT:  TMs pearly gray b/l, OP without erythema LYMPH: no cervical LAD CV: NRRR, normal S1/S2 Resp: CTABL, no wheezes, normal WOB Abd: +BS, soft, NTND. no guarding or organomegaly Ext: No edema, warm Neuro: Alert and oriented, strength equal b/l UE and LE, coordination grossly normal, neg SLR  b/l MSK: point tenderness over upper lumbar region  Assessment & Plan:  Selena Travis was seen today for establish care.  Diagnoses and all orders for this visit:  DM type 2 causing vascular disease ([place]) Having lows. A1c 8.3. Stop glipizide. Short acting insulin as SSI only for BGL over 250. Cont detemir 60u at night and metformin. Has already decreased dose by half. Write down BGLs before meals, bring to clinic in 3 weeks. Any AM BGL less than 100 call office.  -     Exenatide ER (BYDUREON BCISE) 2 MG/0.85ML AUIJ; Inject 2 mg into the skin once a week.  Depression, recurrent ([place]) No thoughts of self harm. Start below -     mirtazapine (REMERON) 15 MG tablet; Take 1 tablet (15 mg total) by mouth at bedtime.  Chronic midline low back pain without sciatica xr to r/o compression fracture. -     DG Lumbar Spine 2-3 Views; Future   Follow up plan: Return in about 3 weeks (around 07/28/2017). Assunta Found, MD [place]

## 2017-07-11 ENCOUNTER — Other Ambulatory Visit: Payer: Self-pay | Admitting: Family

## 2017-07-20 ENCOUNTER — Other Ambulatory Visit: Payer: Self-pay | Admitting: *Deleted

## 2017-07-20 MED ORDER — INSULIN DETEMIR 100 UNIT/ML FLEXPEN: PEN_INJECTOR | SUBCUTANEOUS | 0 refills | Status: DC

## 2017-07-20 MED ORDER — PRAVASTATIN SODIUM 40 MG PO TABS: 40.0000 mg | ORAL_TABLET | Freq: Every day | ORAL | 1 refills | Status: DC

## 2017-07-27 ENCOUNTER — Other Ambulatory Visit: Payer: Self-pay | Admitting: Pediatrics

## 2017-07-27 MED ORDER — INSULIN DETEMIR 100 UNIT/ML FLEXPEN: PEN_INJECTOR | SUBCUTANEOUS | 0 refills | Status: DC

## 2017-07-27 MED ORDER — PRAVASTATIN SODIUM 40 MG PO TABS: 40.0000 mg | ORAL_TABLET | Freq: Every day | ORAL | 0 refills | Status: AC

## 2017-07-27 NOTE — Telephone Encounter (Signed)
No samples available. RFs sent to [place] per patients request. Left detailed message on patients voicemail

## 2017-08-02 ENCOUNTER — Ambulatory Visit (INDEPENDENT_AMBULATORY_CARE_PROVIDER_SITE_OTHER): Payer: Medicare Other | Admitting: Pediatrics

## 2017-08-02 ENCOUNTER — Encounter: Payer: Self-pay | Admitting: Pediatrics

## 2017-08-02 VITALS — BP 103/66 | HR 87 | Temp 97.0°F | Ht 62.0 in | Wt 191.0 lb

## 2017-08-02 DIAGNOSIS — R4589 Other symptoms and signs involving emotional state: Secondary | ICD-10-CM

## 2017-08-02 DIAGNOSIS — I25708 Atherosclerosis of coronary artery bypass graft(s), unspecified, with other forms of angina pectoris: Secondary | ICD-10-CM | POA: Diagnosis not present

## 2017-08-02 DIAGNOSIS — F329 Major depressive disorder, single episode, unspecified: Secondary | ICD-10-CM

## 2017-08-02 DIAGNOSIS — E1159 Type 2 diabetes mellitus with other circulatory complications: Secondary | ICD-10-CM

## 2017-08-02 MED ORDER — INSULIN DETEMIR 100 UNIT/ML FLEXPEN: PEN_INJECTOR | SUBCUTANEOUS | 0 refills | Status: AC

## 2017-08-02 NOTE — Progress Notes (Signed)
  Subjective:   Patient ID: Selena Travis, female    DOB: 1940/05/07, 77 y.o.   MRN: [id number] CC: Medical Management of Chronic Issues (Blood sugars)  HPI: Selena Travis is a 77 y.o. female   Here for 3-week follow-up, was having low blood sugars at last visit  Diabetes: Started on Ozempic with samples last visit.  Bydureon preferred by her insurance.  Has been tolerating well.  Blood sugar numbers with her, morning numbers low 100s, often in the 80s.  A couple lows into the 60s.   Before dinnertime upper 100s to low 200s.  Taking Levemir 60 units at night.  Also on metformin.  Taking medicines regularly.  Decreased mood: lonely in this area, family and support is in [place].  May have to move soon.  Did not start the mirtazapine discussed at last visit.  Feels safe at home.  No thoughts of self-harm.  Elevated BMI: Had some weight loss with the start of Ozempic.  Appetite overall has been decreased.  Relevant past medical, surgical, family and social history reviewed. Allergies and medications reviewed and updated. Social History   Tobacco Use  Smoking Status Former Smoker  . Packs/day: 0.20  . Years: 30.00  . Pack years: 6.00  . Types: Cigarettes  . Last attempt to quit: 1999  . Years since quitting: 20.4  Smokeless Tobacco Never Used   ROS: Per HPI   Objective:    BP 103/66   Pulse 87   Temp (!) 97 F (36.1 C) (Oral)   Ht 5\' 2"  (1.575 m)   Wt 191 lb (86.6 kg)   BMI 34.93 kg/m   Wt Readings from Last 3 Encounters:  08/02/17 191 lb (86.6 kg)  07/07/17 203 lb 12.8 oz (92.4 kg)  05/29/17 201 lb (91.2 kg)    Gen: NAD, alert, cooperative with exam, NCAT EYES: EOMI, no conjunctival injection, or no icterus ENT:  OP without erythema LYMPH: no cervical LAD CV: NRRR, normal S1/S2, no murmur, distal pulses 2+ b/l Resp: CTABL, no wheezes, normal WOB Ext: No edema, warm Neuro: Alert and oriented MSK: normal muscle bulk Psych: Normal affect, tearful at times.  No  thoughts of self-harm.  Assessment & Plan:  Selena Travis was seen today for medical management of chronic issues.  Diagnoses and all orders for this visit:  DM type 2 causing vascular disease ([place]) A1c in mid March 9 0.2.  Much improved blood sugars on current regimen.  Still with some low numbers in the morning.  Will decrease to 55 units of Levemir, switch to bydureon as preferred by her insurance.  Depressed mood Ongoing symptoms.  Open to virtual behavioral health referral.  Declines medication initiation right now.  Let me know if symptoms worsen.  Other orders -     Insulin Detemir (LEVEMIR FLEXTOUCH) 100 UNIT/ML Pen; INJECT 55 UNITS UNDER THE SKIN AT BEDTIME   Follow up plan: Return in about 1 month (around 08/30/2017).  For repeat A1c Assunta Found, MD [place]

## 2017-08-03 ENCOUNTER — Telehealth: Payer: Self-pay | Admitting: Clinical

## 2017-08-03 NOTE — Telephone Encounter (Signed)
TC to Selena Travis and [facility] specialist introduced herself.  Selena Travis reported this is not a good time to talk since she is leaving the house but open to having [facility] specialist contact her this afternoon.

## 2017-08-09 ENCOUNTER — Telehealth: Payer: Self-pay | Admitting: Licensed Clinical Social Worker

## 2017-08-09 NOTE — Telephone Encounter (Signed)
This [facility] specialist left message to call back with name and contact information. 

## 2017-08-29 ENCOUNTER — Ambulatory Visit: Payer: Medicare Other | Admitting: Family

## 2017-09-01 ENCOUNTER — Ambulatory Visit: Payer: Medicare Other | Admitting: Pediatrics

## 2017-09-05 ENCOUNTER — Other Ambulatory Visit: Payer: Self-pay | Admitting: Pediatrics

## 2017-09-06 NOTE — Telephone Encounter (Signed)
Pt needing Exenatide ER (BYDUREON BCISE) 2 MG/0.85ML AUIJ samples. Cathy H put 2 boxes up for her and she is aware per her VM.

## 2017-09-12 ENCOUNTER — Telehealth: Payer: Self-pay

## 2017-09-12 NOTE — Telephone Encounter (Signed)
[facility] - Writer has made multiple attempts to contact the patient on 08/02/2017; 08/03/2017; 08/09/2017; 6/2/72019 and 09/12/2017.   Writer will place the patient on the inactive list.    Writer informed the PCP and Dr. Modesta Messing through staff messaging that the patient will be placed on the inactive list.

## 2017-09-29 ENCOUNTER — Encounter: Payer: Medicare Other | Admitting: Internal Medicine

## 2017-10-02 DIAGNOSIS — I25810 Atherosclerosis of coronary artery bypass graft(s) without angina pectoris: Secondary | ICD-10-CM | POA: Diagnosis not present

## 2017-10-02 DIAGNOSIS — F331 Major depressive disorder, recurrent, moderate: Secondary | ICD-10-CM | POA: Diagnosis not present

## 2017-10-02 DIAGNOSIS — M545 Low back pain: Secondary | ICD-10-CM | POA: Diagnosis not present

## 2017-10-02 DIAGNOSIS — D229 Melanocytic nevi, unspecified: Secondary | ICD-10-CM | POA: Diagnosis not present

## 2017-10-02 DIAGNOSIS — G8929 Other chronic pain: Secondary | ICD-10-CM | POA: Diagnosis not present

## 2017-10-02 DIAGNOSIS — H903 Sensorineural hearing loss, bilateral: Secondary | ICD-10-CM | POA: Diagnosis not present

## 2017-10-02 DIAGNOSIS — Z7982 Long term (current) use of aspirin: Secondary | ICD-10-CM | POA: Diagnosis not present

## 2017-10-02 DIAGNOSIS — E782 Mixed hyperlipidemia: Secondary | ICD-10-CM | POA: Diagnosis not present

## 2017-10-02 DIAGNOSIS — Z794 Long term (current) use of insulin: Secondary | ICD-10-CM | POA: Diagnosis not present

## 2017-10-02 DIAGNOSIS — Z1231 Encounter for screening mammogram for malignant neoplasm of breast: Secondary | ICD-10-CM | POA: Diagnosis not present

## 2017-10-02 DIAGNOSIS — M810 Age-related osteoporosis without current pathological fracture: Secondary | ICD-10-CM | POA: Diagnosis not present

## 2017-10-02 DIAGNOSIS — Z87891 Personal history of nicotine dependence: Secondary | ICD-10-CM | POA: Diagnosis not present

## 2017-10-02 DIAGNOSIS — E1143 Type 2 diabetes mellitus with diabetic autonomic (poly)neuropathy: Secondary | ICD-10-CM | POA: Diagnosis not present

## 2017-10-31 DIAGNOSIS — E1143 Type 2 diabetes mellitus with diabetic autonomic (poly)neuropathy: Secondary | ICD-10-CM | POA: Diagnosis not present

## 2017-10-31 DIAGNOSIS — Z794 Long term (current) use of insulin: Secondary | ICD-10-CM | POA: Diagnosis not present

## 2017-10-31 DIAGNOSIS — L03119 Cellulitis of unspecified part of limb: Secondary | ICD-10-CM | POA: Diagnosis not present

## 2017-10-31 DIAGNOSIS — D492 Neoplasm of unspecified behavior of bone, soft tissue, and skin: Secondary | ICD-10-CM | POA: Diagnosis not present

## 2017-11-03 DIAGNOSIS — D492 Neoplasm of unspecified behavior of bone, soft tissue, and skin: Secondary | ICD-10-CM | POA: Diagnosis not present

## 2017-11-03 DIAGNOSIS — L570 Actinic keratosis: Secondary | ICD-10-CM | POA: Diagnosis not present

## 2017-11-03 DIAGNOSIS — L03116 Cellulitis of left lower limb: Secondary | ICD-10-CM | POA: Diagnosis not present

## 2017-11-10 DIAGNOSIS — E11621 Type 2 diabetes mellitus with foot ulcer: Secondary | ICD-10-CM | POA: Diagnosis not present

## 2017-11-10 DIAGNOSIS — Z794 Long term (current) use of insulin: Secondary | ICD-10-CM | POA: Diagnosis not present

## 2017-11-10 DIAGNOSIS — L97521 Non-pressure chronic ulcer of other part of left foot limited to breakdown of skin: Secondary | ICD-10-CM | POA: Diagnosis not present

## 2017-11-10 DIAGNOSIS — Z4802 Encounter for removal of sutures: Secondary | ICD-10-CM | POA: Diagnosis not present

## 2017-11-14 DIAGNOSIS — L97522 Non-pressure chronic ulcer of other part of left foot with fat layer exposed: Secondary | ICD-10-CM | POA: Diagnosis not present

## 2017-11-14 DIAGNOSIS — E1140 Type 2 diabetes mellitus with diabetic neuropathy, unspecified: Secondary | ICD-10-CM | POA: Diagnosis not present

## 2017-11-14 DIAGNOSIS — E1159 Type 2 diabetes mellitus with other circulatory complications: Secondary | ICD-10-CM | POA: Diagnosis not present

## 2017-11-14 DIAGNOSIS — L089 Local infection of the skin and subcutaneous tissue, unspecified: Secondary | ICD-10-CM | POA: Diagnosis not present

## 2017-11-20 DIAGNOSIS — R42 Dizziness and giddiness: Secondary | ICD-10-CM | POA: Diagnosis not present

## 2017-11-27 DIAGNOSIS — E1159 Type 2 diabetes mellitus with other circulatory complications: Secondary | ICD-10-CM | POA: Diagnosis not present

## 2017-11-27 DIAGNOSIS — L089 Local infection of the skin and subcutaneous tissue, unspecified: Secondary | ICD-10-CM | POA: Diagnosis not present

## 2017-11-27 DIAGNOSIS — L97522 Non-pressure chronic ulcer of other part of left foot with fat layer exposed: Secondary | ICD-10-CM | POA: Diagnosis not present

## 2017-11-27 DIAGNOSIS — E1140 Type 2 diabetes mellitus with diabetic neuropathy, unspecified: Secondary | ICD-10-CM | POA: Diagnosis not present

## 2017-11-27 DIAGNOSIS — M86171 Other acute osteomyelitis, right ankle and foot: Secondary | ICD-10-CM | POA: Diagnosis not present

## 2017-12-04 DIAGNOSIS — E1140 Type 2 diabetes mellitus with diabetic neuropathy, unspecified: Secondary | ICD-10-CM | POA: Diagnosis not present

## 2017-12-04 DIAGNOSIS — E1159 Type 2 diabetes mellitus with other circulatory complications: Secondary | ICD-10-CM | POA: Diagnosis not present

## 2017-12-04 DIAGNOSIS — L089 Local infection of the skin and subcutaneous tissue, unspecified: Secondary | ICD-10-CM | POA: Diagnosis not present

## 2017-12-04 DIAGNOSIS — L97522 Non-pressure chronic ulcer of other part of left foot with fat layer exposed: Secondary | ICD-10-CM | POA: Diagnosis not present

## 2017-12-04 DIAGNOSIS — M86171 Other acute osteomyelitis, right ankle and foot: Secondary | ICD-10-CM | POA: Diagnosis not present

## 2017-12-05 DIAGNOSIS — I70235 Atherosclerosis of native arteries of right leg with ulceration of other part of foot: Secondary | ICD-10-CM | POA: Diagnosis not present

## 2017-12-05 DIAGNOSIS — I7025 Atherosclerosis of native arteries of other extremities with ulceration: Secondary | ICD-10-CM | POA: Diagnosis not present

## 2017-12-06 DIAGNOSIS — I70235 Atherosclerosis of native arteries of right leg with ulceration of other part of foot: Secondary | ICD-10-CM | POA: Diagnosis not present

## 2017-12-06 DIAGNOSIS — I739 Peripheral vascular disease, unspecified: Secondary | ICD-10-CM | POA: Diagnosis not present

## 2017-12-18 DIAGNOSIS — E1159 Type 2 diabetes mellitus with other circulatory complications: Secondary | ICD-10-CM | POA: Diagnosis not present

## 2017-12-18 DIAGNOSIS — M86171 Other acute osteomyelitis, right ankle and foot: Secondary | ICD-10-CM | POA: Diagnosis not present

## 2017-12-18 DIAGNOSIS — E1140 Type 2 diabetes mellitus with diabetic neuropathy, unspecified: Secondary | ICD-10-CM | POA: Diagnosis not present

## 2017-12-18 DIAGNOSIS — L089 Local infection of the skin and subcutaneous tissue, unspecified: Secondary | ICD-10-CM | POA: Diagnosis not present

## 2017-12-18 DIAGNOSIS — L97522 Non-pressure chronic ulcer of other part of left foot with fat layer exposed: Secondary | ICD-10-CM | POA: Diagnosis not present

## 2018-01-03 DIAGNOSIS — L97522 Non-pressure chronic ulcer of other part of left foot with fat layer exposed: Secondary | ICD-10-CM | POA: Diagnosis not present

## 2018-01-03 DIAGNOSIS — M86671 Other chronic osteomyelitis, right ankle and foot: Secondary | ICD-10-CM | POA: Diagnosis not present

## 2018-01-03 DIAGNOSIS — M869 Osteomyelitis, unspecified: Secondary | ICD-10-CM | POA: Diagnosis not present

## 2018-01-03 DIAGNOSIS — L8993 Pressure ulcer of unspecified site, stage 3: Secondary | ICD-10-CM | POA: Diagnosis not present

## 2018-01-03 DIAGNOSIS — M86171 Other acute osteomyelitis, right ankle and foot: Secondary | ICD-10-CM | POA: Diagnosis not present

## 2018-01-03 DIAGNOSIS — L03031 Cellulitis of right toe: Secondary | ICD-10-CM | POA: Diagnosis not present

## 2018-01-03 DIAGNOSIS — E1159 Type 2 diabetes mellitus with other circulatory complications: Secondary | ICD-10-CM | POA: Diagnosis not present

## 2018-01-03 DIAGNOSIS — I96 Gangrene, not elsewhere classified: Secondary | ICD-10-CM | POA: Diagnosis not present

## 2018-01-10 DIAGNOSIS — H903 Sensorineural hearing loss, bilateral: Secondary | ICD-10-CM | POA: Diagnosis not present

## 2018-01-11 DIAGNOSIS — E1159 Type 2 diabetes mellitus with other circulatory complications: Secondary | ICD-10-CM | POA: Diagnosis not present

## 2018-01-11 DIAGNOSIS — E1140 Type 2 diabetes mellitus with diabetic neuropathy, unspecified: Secondary | ICD-10-CM | POA: Diagnosis not present

## 2018-01-11 DIAGNOSIS — M86171 Other acute osteomyelitis, right ankle and foot: Secondary | ICD-10-CM | POA: Diagnosis not present

## 2018-01-16 DIAGNOSIS — E1140 Type 2 diabetes mellitus with diabetic neuropathy, unspecified: Secondary | ICD-10-CM | POA: Diagnosis not present

## 2018-01-16 DIAGNOSIS — M86171 Other acute osteomyelitis, right ankle and foot: Secondary | ICD-10-CM | POA: Diagnosis not present

## 2018-01-16 DIAGNOSIS — E1159 Type 2 diabetes mellitus with other circulatory complications: Secondary | ICD-10-CM | POA: Diagnosis not present

## 2018-01-22 DIAGNOSIS — Z1339 Encounter for screening examination for other mental health and behavioral disorders: Secondary | ICD-10-CM | POA: Diagnosis not present

## 2018-01-22 DIAGNOSIS — Z87891 Personal history of nicotine dependence: Secondary | ICD-10-CM | POA: Diagnosis not present

## 2018-01-22 DIAGNOSIS — Z794 Long term (current) use of insulin: Secondary | ICD-10-CM | POA: Diagnosis not present

## 2018-01-22 DIAGNOSIS — E1143 Type 2 diabetes mellitus with diabetic autonomic (poly)neuropathy: Secondary | ICD-10-CM | POA: Diagnosis not present

## 2018-01-22 DIAGNOSIS — I739 Peripheral vascular disease, unspecified: Secondary | ICD-10-CM | POA: Diagnosis not present

## 2018-01-22 DIAGNOSIS — Z89421 Acquired absence of other right toe(s): Secondary | ICD-10-CM | POA: Diagnosis not present

## 2018-01-24 DIAGNOSIS — H903 Sensorineural hearing loss, bilateral: Secondary | ICD-10-CM | POA: Diagnosis not present

## 2018-03-01 DIAGNOSIS — E1143 Type 2 diabetes mellitus with diabetic autonomic (poly)neuropathy: Secondary | ICD-10-CM | POA: Diagnosis not present

## 2018-03-01 DIAGNOSIS — G901 Familial dysautonomia [Riley-Day]: Secondary | ICD-10-CM | POA: Diagnosis not present

## 2018-03-01 DIAGNOSIS — Z7984 Long term (current) use of oral hypoglycemic drugs: Secondary | ICD-10-CM | POA: Diagnosis not present

## 2018-03-01 DIAGNOSIS — Z7982 Long term (current) use of aspirin: Secondary | ICD-10-CM | POA: Diagnosis not present

## 2018-03-01 DIAGNOSIS — Z6830 Body mass index (BMI) 30.0-30.9, adult: Secondary | ICD-10-CM | POA: Diagnosis not present

## 2018-03-01 DIAGNOSIS — E669 Obesity, unspecified: Secondary | ICD-10-CM | POA: Diagnosis not present

## 2018-03-14 ENCOUNTER — Other Ambulatory Visit: Payer: Self-pay | Admitting: Pediatrics

## 2018-03-24 DIAGNOSIS — R42 Dizziness and giddiness: Secondary | ICD-10-CM | POA: Diagnosis not present

## 2018-03-26 DIAGNOSIS — Z0000 Encounter for general adult medical examination without abnormal findings: Secondary | ICD-10-CM | POA: Diagnosis not present

## 2018-03-26 DIAGNOSIS — E1165 Type 2 diabetes mellitus with hyperglycemia: Secondary | ICD-10-CM | POA: Diagnosis not present

## 2018-03-26 DIAGNOSIS — G901 Familial dysautonomia [Riley-Day]: Secondary | ICD-10-CM | POA: Diagnosis not present

## 2018-03-26 DIAGNOSIS — Z7984 Long term (current) use of oral hypoglycemic drugs: Secondary | ICD-10-CM | POA: Diagnosis not present

## 2018-03-26 DIAGNOSIS — I70209 Unspecified atherosclerosis of native arteries of extremities, unspecified extremity: Secondary | ICD-10-CM | POA: Diagnosis not present

## 2018-04-02 DIAGNOSIS — I739 Peripheral vascular disease, unspecified: Secondary | ICD-10-CM | POA: Diagnosis not present

## 2018-04-02 DIAGNOSIS — Z7984 Long term (current) use of oral hypoglycemic drugs: Secondary | ICD-10-CM | POA: Diagnosis not present

## 2018-04-02 DIAGNOSIS — R0600 Dyspnea, unspecified: Secondary | ICD-10-CM | POA: Diagnosis not present

## 2018-04-02 DIAGNOSIS — R42 Dizziness and giddiness: Secondary | ICD-10-CM | POA: Diagnosis not present

## 2018-04-02 DIAGNOSIS — R9431 Abnormal electrocardiogram [ECG] [EKG]: Secondary | ICD-10-CM | POA: Diagnosis not present

## 2018-04-02 DIAGNOSIS — I25810 Atherosclerosis of coronary artery bypass graft(s) without angina pectoris: Secondary | ICD-10-CM | POA: Diagnosis not present

## 2018-04-02 DIAGNOSIS — E119 Type 2 diabetes mellitus without complications: Secondary | ICD-10-CM | POA: Diagnosis not present

## 2018-04-13 DIAGNOSIS — Z6829 Body mass index (BMI) 29.0-29.9, adult: Secondary | ICD-10-CM | POA: Diagnosis not present

## 2018-04-13 DIAGNOSIS — E663 Overweight: Secondary | ICD-10-CM | POA: Diagnosis not present

## 2018-04-13 DIAGNOSIS — I2510 Atherosclerotic heart disease of native coronary artery without angina pectoris: Secondary | ICD-10-CM | POA: Diagnosis not present

## 2018-04-13 DIAGNOSIS — E1143 Type 2 diabetes mellitus with diabetic autonomic (poly)neuropathy: Secondary | ICD-10-CM | POA: Diagnosis not present

## 2018-04-13 DIAGNOSIS — E11649 Type 2 diabetes mellitus with hypoglycemia without coma: Secondary | ICD-10-CM | POA: Diagnosis not present

## 2018-04-13 DIAGNOSIS — Z7982 Long term (current) use of aspirin: Secondary | ICD-10-CM | POA: Diagnosis not present

## 2018-04-13 DIAGNOSIS — Z7984 Long term (current) use of oral hypoglycemic drugs: Secondary | ICD-10-CM | POA: Diagnosis not present

## 2018-04-13 DIAGNOSIS — Z79899 Other long term (current) drug therapy: Secondary | ICD-10-CM | POA: Diagnosis not present

## 2018-04-23 DIAGNOSIS — Z7984 Long term (current) use of oral hypoglycemic drugs: Secondary | ICD-10-CM | POA: Diagnosis not present

## 2018-04-23 DIAGNOSIS — R9431 Abnormal electrocardiogram [ECG] [EKG]: Secondary | ICD-10-CM | POA: Diagnosis not present

## 2018-04-23 DIAGNOSIS — R9439 Abnormal result of other cardiovascular function study: Secondary | ICD-10-CM | POA: Diagnosis not present

## 2018-04-23 DIAGNOSIS — E1143 Type 2 diabetes mellitus with diabetic autonomic (poly)neuropathy: Secondary | ICD-10-CM | POA: Diagnosis not present

## 2018-07-10 DIAGNOSIS — N94818 Other vulvodynia: Secondary | ICD-10-CM | POA: Diagnosis not present

## 2018-07-10 DIAGNOSIS — R300 Dysuria: Secondary | ICD-10-CM | POA: Diagnosis not present

## 2018-07-10 DIAGNOSIS — N3000 Acute cystitis without hematuria: Secondary | ICD-10-CM | POA: Diagnosis not present

## 2018-09-05 DIAGNOSIS — E11649 Type 2 diabetes mellitus with hypoglycemia without coma: Secondary | ICD-10-CM | POA: Diagnosis not present

## 2018-09-05 DIAGNOSIS — Z7984 Long term (current) use of oral hypoglycemic drugs: Secondary | ICD-10-CM | POA: Diagnosis not present

## 2018-10-09 DIAGNOSIS — Z79899 Other long term (current) drug therapy: Secondary | ICD-10-CM | POA: Diagnosis not present

## 2018-10-09 DIAGNOSIS — F329 Major depressive disorder, single episode, unspecified: Secondary | ICD-10-CM | POA: Diagnosis not present

## 2018-10-09 DIAGNOSIS — M545 Low back pain: Secondary | ICD-10-CM | POA: Diagnosis not present

## 2018-10-09 DIAGNOSIS — G8929 Other chronic pain: Secondary | ICD-10-CM | POA: Diagnosis not present

## 2018-10-10 ENCOUNTER — Other Ambulatory Visit: Payer: Self-pay

## 2018-10-16 DIAGNOSIS — Z03818 Encounter for observation for suspected exposure to other biological agents ruled out: Secondary | ICD-10-CM | POA: Diagnosis not present

## 2018-10-16 DIAGNOSIS — Z20828 Contact with and (suspected) exposure to other viral communicable diseases: Secondary | ICD-10-CM | POA: Diagnosis not present

## 2018-10-17 DIAGNOSIS — M545 Low back pain: Secondary | ICD-10-CM | POA: Diagnosis not present

## 2018-11-06 DIAGNOSIS — Z794 Long term (current) use of insulin: Secondary | ICD-10-CM | POA: Diagnosis not present

## 2018-11-06 DIAGNOSIS — F419 Anxiety disorder, unspecified: Secondary | ICD-10-CM | POA: Diagnosis not present

## 2018-11-06 DIAGNOSIS — F329 Major depressive disorder, single episode, unspecified: Secondary | ICD-10-CM | POA: Diagnosis not present

## 2018-11-06 DIAGNOSIS — E1143 Type 2 diabetes mellitus with diabetic autonomic (poly)neuropathy: Secondary | ICD-10-CM | POA: Diagnosis not present

## 2018-11-30 DIAGNOSIS — Z79899 Other long term (current) drug therapy: Secondary | ICD-10-CM | POA: Diagnosis not present

## 2018-11-30 DIAGNOSIS — R42 Dizziness and giddiness: Secondary | ICD-10-CM | POA: Diagnosis not present

## 2018-12-11 DIAGNOSIS — R42 Dizziness and giddiness: Secondary | ICD-10-CM | POA: Diagnosis not present

## 2019-01-16 DIAGNOSIS — Z794 Long term (current) use of insulin: Secondary | ICD-10-CM | POA: Diagnosis not present

## 2019-01-16 DIAGNOSIS — E119 Type 2 diabetes mellitus without complications: Secondary | ICD-10-CM | POA: Diagnosis not present

## 2019-01-16 DIAGNOSIS — Y92009 Unspecified place in unspecified non-institutional (private) residence as the place of occurrence of the external cause: Secondary | ICD-10-CM | POA: Diagnosis not present

## 2019-01-16 DIAGNOSIS — S0101XA Laceration without foreign body of scalp, initial encounter: Secondary | ICD-10-CM | POA: Diagnosis not present

## 2019-01-16 DIAGNOSIS — W01198A Fall on same level from slipping, tripping and stumbling with subsequent striking against other object, initial encounter: Secondary | ICD-10-CM | POA: Diagnosis not present

## 2019-01-16 DIAGNOSIS — S0990XA Unspecified injury of head, initial encounter: Secondary | ICD-10-CM | POA: Diagnosis not present

## 2019-01-16 DIAGNOSIS — R55 Syncope and collapse: Secondary | ICD-10-CM | POA: Diagnosis not present

## 2019-01-16 DIAGNOSIS — I10 Essential (primary) hypertension: Secondary | ICD-10-CM | POA: Diagnosis not present

## 2019-01-21 IMAGING — NM NM MYOCAR MULTI W/SPECT W/WALL MOTION & EF
2 series · 12 of 12 positions shown · non-contrast
Comparison: none

[Series 1: rest · 6.51mm/px · 6 of 64 frames shown]
[frame 6/64]
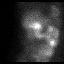
[frame 16/64]
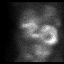
[frame 27/64]
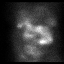
[frame 38/64]
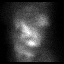
[frame 48/64]
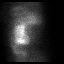
[frame 59/64]
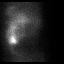

[Series 3: stress gated - perfusion · 6.51mm/px · 6 of 64 frames shown]
[frame 6/64]
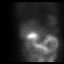
[frame 16/64]
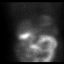
[frame 27/64]
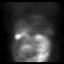
[frame 38/64]
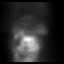
[frame 48/64]
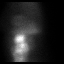
[frame 59/64]
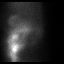

[12 of 12 positions shown; findings below may reference images not displayed]

Canned report from images found in remote index.

Refer to host system for actual result text.

## 2019-01-25 DIAGNOSIS — H02839 Dermatochalasis of unspecified eye, unspecified eyelid: Secondary | ICD-10-CM | POA: Diagnosis not present

## 2019-01-25 DIAGNOSIS — H5203 Hypermetropia, bilateral: Secondary | ICD-10-CM | POA: Diagnosis not present

## 2019-01-25 DIAGNOSIS — Z135 Encounter for screening for eye and ear disorders: Secondary | ICD-10-CM | POA: Diagnosis not present

## 2019-01-25 DIAGNOSIS — Z961 Presence of intraocular lens: Secondary | ICD-10-CM | POA: Diagnosis not present

## 2019-01-25 DIAGNOSIS — H524 Presbyopia: Secondary | ICD-10-CM | POA: Diagnosis not present

## 2019-01-25 DIAGNOSIS — H52223 Regular astigmatism, bilateral: Secondary | ICD-10-CM | POA: Diagnosis not present

## 2022-05-13 DEATH — deceased
